# Patient Record
Sex: Female | Born: 1945 | Race: Black or African American | Hispanic: No | Marital: Married | Smoking: Former smoker
Health system: Southern US, Community
[De-identification: ages and names within clinical notes are randomized; demographics above are authoritative.]

## PROBLEM LIST (undated history)

## (undated) DIAGNOSIS — I10 Essential (primary) hypertension: Secondary | ICD-10-CM

## (undated) DIAGNOSIS — E785 Hyperlipidemia, unspecified: Secondary | ICD-10-CM

## (undated) HISTORY — DX: Essential (primary) hypertension: I10

## (undated) HISTORY — DX: Hyperlipidemia, unspecified: E78.5

---

## 1999-01-12 ENCOUNTER — Other Ambulatory Visit: Admission: RE | Admit: 1999-01-12 | Discharge: 1999-01-12 | Payer: Self-pay | Admitting: Obstetrics & Gynecology

## 1999-02-22 ENCOUNTER — Encounter: Payer: Self-pay | Admitting: Obstetrics & Gynecology

## 1999-02-22 ENCOUNTER — Encounter: Admission: RE | Admit: 1999-02-22 | Discharge: 1999-02-22 | Payer: Self-pay | Admitting: Obstetrics & Gynecology

## 1999-04-20 ENCOUNTER — Encounter: Payer: Self-pay | Admitting: Cardiology

## 1999-04-20 ENCOUNTER — Ambulatory Visit (HOSPITAL_COMMUNITY): Admission: RE | Admit: 1999-04-20 | Discharge: 1999-04-20 | Payer: Self-pay

## 2001-07-25 ENCOUNTER — Encounter: Payer: Self-pay | Admitting: Cardiology

## 2001-07-25 ENCOUNTER — Ambulatory Visit (HOSPITAL_COMMUNITY): Admission: RE | Admit: 2001-07-25 | Discharge: 2001-07-25 | Payer: Self-pay | Admitting: Cardiology

## 2005-11-21 ENCOUNTER — Ambulatory Visit (HOSPITAL_COMMUNITY): Admission: RE | Admit: 2005-11-21 | Discharge: 2005-11-21 | Payer: Self-pay | Admitting: Obstetrics and Gynecology

## 2007-04-28 ENCOUNTER — Encounter: Admission: RE | Admit: 2007-04-28 | Discharge: 2007-04-28 | Payer: Self-pay | Admitting: Obstetrics & Gynecology

## 2008-07-09 ENCOUNTER — Encounter (INDEPENDENT_AMBULATORY_CARE_PROVIDER_SITE_OTHER): Payer: Self-pay | Admitting: Cardiology

## 2008-07-09 ENCOUNTER — Ambulatory Visit: Payer: Self-pay | Admitting: Surgery

## 2008-07-09 ENCOUNTER — Ambulatory Visit (HOSPITAL_COMMUNITY): Admission: RE | Admit: 2008-07-09 | Discharge: 2008-07-09 | Payer: Self-pay | Admitting: Cardiology

## 2008-09-27 ENCOUNTER — Ambulatory Visit: Payer: Self-pay | Admitting: Surgery

## 2008-12-17 ENCOUNTER — Emergency Department (HOSPITAL_COMMUNITY): Admission: EM | Admit: 2008-12-17 | Discharge: 2008-12-18 | Payer: Self-pay | Admitting: Emergency Medicine

## 2009-09-27 ENCOUNTER — Ambulatory Visit (HOSPITAL_COMMUNITY): Admission: RE | Admit: 2009-09-27 | Discharge: 2009-09-27 | Payer: Self-pay | Admitting: Cardiology

## 2009-12-26 ENCOUNTER — Encounter: Admission: RE | Admit: 2009-12-26 | Discharge: 2009-12-26 | Payer: Self-pay | Admitting: Orthopedic Surgery

## 2010-02-18 ENCOUNTER — Encounter: Payer: Self-pay | Admitting: Obstetrics and Gynecology

## 2010-05-03 LAB — CBC
HCT: 38.6 % (ref 36.0–46.0)
Hemoglobin: 13.1 g/dL (ref 12.0–15.0)
MCV: 91.3 fL (ref 78.0–100.0)
RBC: 4.23 MIL/uL (ref 3.87–5.11)
WBC: 12.4 10*3/uL — ABNORMAL HIGH (ref 4.0–10.5)

## 2010-05-03 LAB — URINE MICROSCOPIC-ADD ON

## 2010-05-03 LAB — DIFFERENTIAL
Eosinophils Absolute: 0 10*3/uL (ref 0.0–0.7)
Eosinophils Relative: 0 % (ref 0–5)
Lymphs Abs: 1.4 10*3/uL (ref 0.7–4.0)
Monocytes Relative: 8 % (ref 3–12)

## 2010-05-03 LAB — URINALYSIS, ROUTINE W REFLEX MICROSCOPIC
Bilirubin Urine: NEGATIVE
Hgb urine dipstick: NEGATIVE
Protein, ur: 30 mg/dL — AB
Urobilinogen, UA: 1 mg/dL (ref 0.0–1.0)

## 2010-05-03 LAB — BASIC METABOLIC PANEL
Chloride: 98 mEq/L (ref 96–112)
GFR calc Af Amer: >60 mL/min (ref >60)
Potassium: 3.3 mEq/L — ABNORMAL LOW (ref 3.5–5.1)

## 2010-06-13 NOTE — Assessment & Plan Note (Signed)
OFFICE VISIT   Zoe Arnold, Zoe Arnold  DOB:  1945/07/30                                       09/27/2008  ZOXWR#:[id number]   REASON FOR VISIT:  Bilateral swelling.   HISTORY:  This is a 65 year old female I am seeing at the request of Dr.  Shana Chute for evaluation of leg swelling.  The patient states that she has  been having swelling in her right leg for quite some time.  In February,  while [place] the mountains hiking, she developed a stinging and burning  feeling in her left foot, her husband noticed it to be swollen.  She has  been having progressive problems with swelling.  She says that she had  some skin sloughing, but that has now resolved.  She does have some  white stockings that she has borrowed from her mother, that sound like  TED hose, that do improve the heaviness in her legs, elevation also  improves them and, obviously, her legs are worse when she is on them all  day.   PAST MEDICAL HISTORY:  Significant for hypertension.   FAMILY HISTORY:  Noncontributory.   SOCIAL HISTORY:  She is married with 2 children.  She is retired.  Does  not smoke, does not drink.   REVIEW OF SYSTEMS:  GENERAL:  Negative for fevers, chills, weight gain,  weight loss.  CARDIAC:  Negative.  PULMONARY:  Negative.  GI:  Positive for reflux and hiatal hernia.  GU:  Negative.  VASCULAR:  Slight pain in her legs with walking.  NEURO:  Negative.  ORTHO:  Negative.  PSYCH:  Negative.  ENT:  Negative.  HEME:  Negative.   MEDICATIONS:  Include Diovan, hydrochlorothiazide and potassium.   ALLERGIES:  Penicillin and tetracycline.   PHYSICAL EXAMINATION:  Blood pressure is 160/96, pulse is 93.  General:  She is well-appearing, in no acute distress.  She is normocephalic,  atraumatic.  Cardiovascular:  Regular rate and rhythm.  Abdomen:  Soft,  respiration is nonlabored.  Extremities:  Are warm and well-perfused.  She has palpable pulses bilaterally.  She has pitting edema  up to the  knee bilaterally.  There is mild hyperpigmentation in the left gaiter  area.  No ulceration.   DIAGNOSTIC STUDIES:  Duplex ultrasound was performed today that shows no  evidence of deep or superficial venous thrombus.  She has no evidence of  a reflux.   ASSESSMENT/PLAN:  Bilateral leg pain.   Plan:  I told the patient I believe her swelling is secondary to  lymphedema.  I have recommended getting fitted for compression  stockings, which I have given her a prescription for.  I have told her  to get measured in the morning so that fit appropriately, she does not  need to wear them at night.  I told her to keep her legs elevated  whenever possible.  She is also being referred to a lymphedema therapist  at [facility].   Jorge Ny, MD  Electronically Signed   VWB/MEDQ  D:  09/27/2008  T:  09/28/2008  Job:  [id number]   cc:   Osvaldo Shipper. Spruill, M.D.

## 2010-06-13 NOTE — Procedures (Signed)
DUPLEX DEEP VENOUS EXAM - LOWER EXTREMITY   INDICATION:  Left lower extremity edema   HISTORY:  Edema:  Yes  Trauma/Surgery:  No  Pain:  No  PE:  No  Previous DVT:  No  Anticoagulants:  No  Other:   DUPLEX EXAM:                CFV   SFV   PopV  PTV    GSV                R  L  R  L  R  L  R   L  R  L  Thrombosis    o  o     o     o      o     o  Spontaneous   +  +     +     +      +     +  Phasic        +  +     +     +      +     +  Augmentation  +  +     +     +      +     +  Compressible  +  +     +     +      +     +  Competent     +  +     +     +      +     +   Legend:  + - yes  o - no  p - partial  D - decreased   IMPRESSION:  No evidence of deep or superficial venous thrombosis in the  left lower extremity.    _____________________________  V. Charlena Cross, MD   AC/MEDQ  D:  09/27/2008  T:  09/27/2008  Job:  [id number]

## 2010-12-31 IMAGING — CR DG ABDOMEN ACUTE W/ 1V CHEST
3 series · 3 of 3 positions shown · non-contrast
Comparison: None

CLINICAL DATA: Abdominal pain, history acid reflux

ACUTE ABDOMEN SERIES (ABDOMEN 2 VIEW & CHEST 1 VIEW)

[w chest pa]
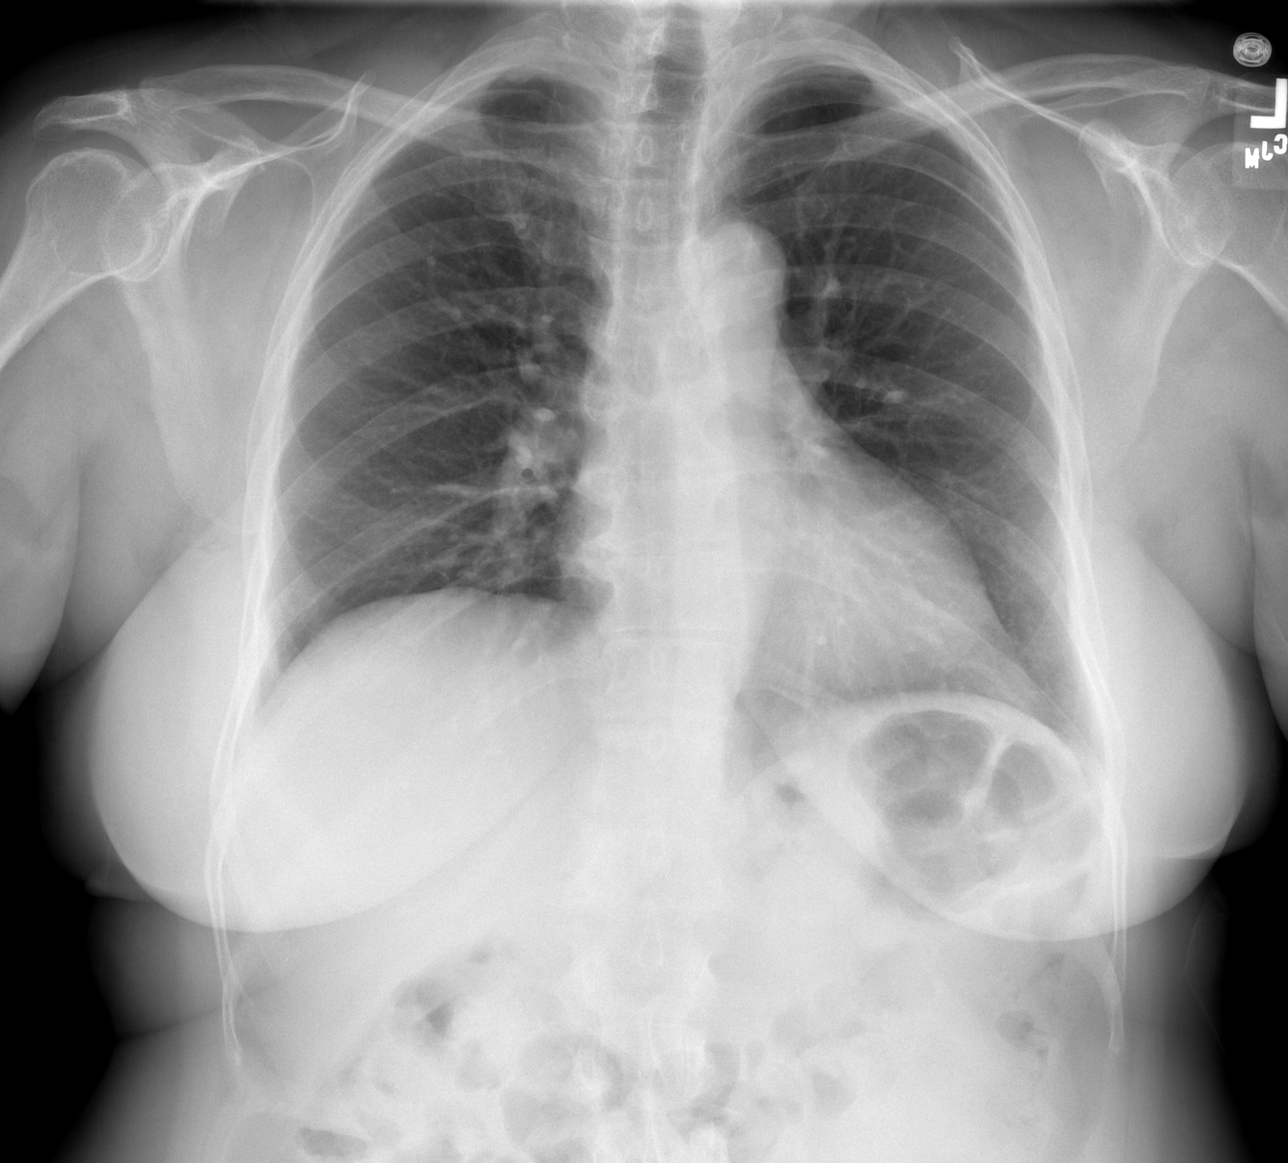

[w abdomen upright]
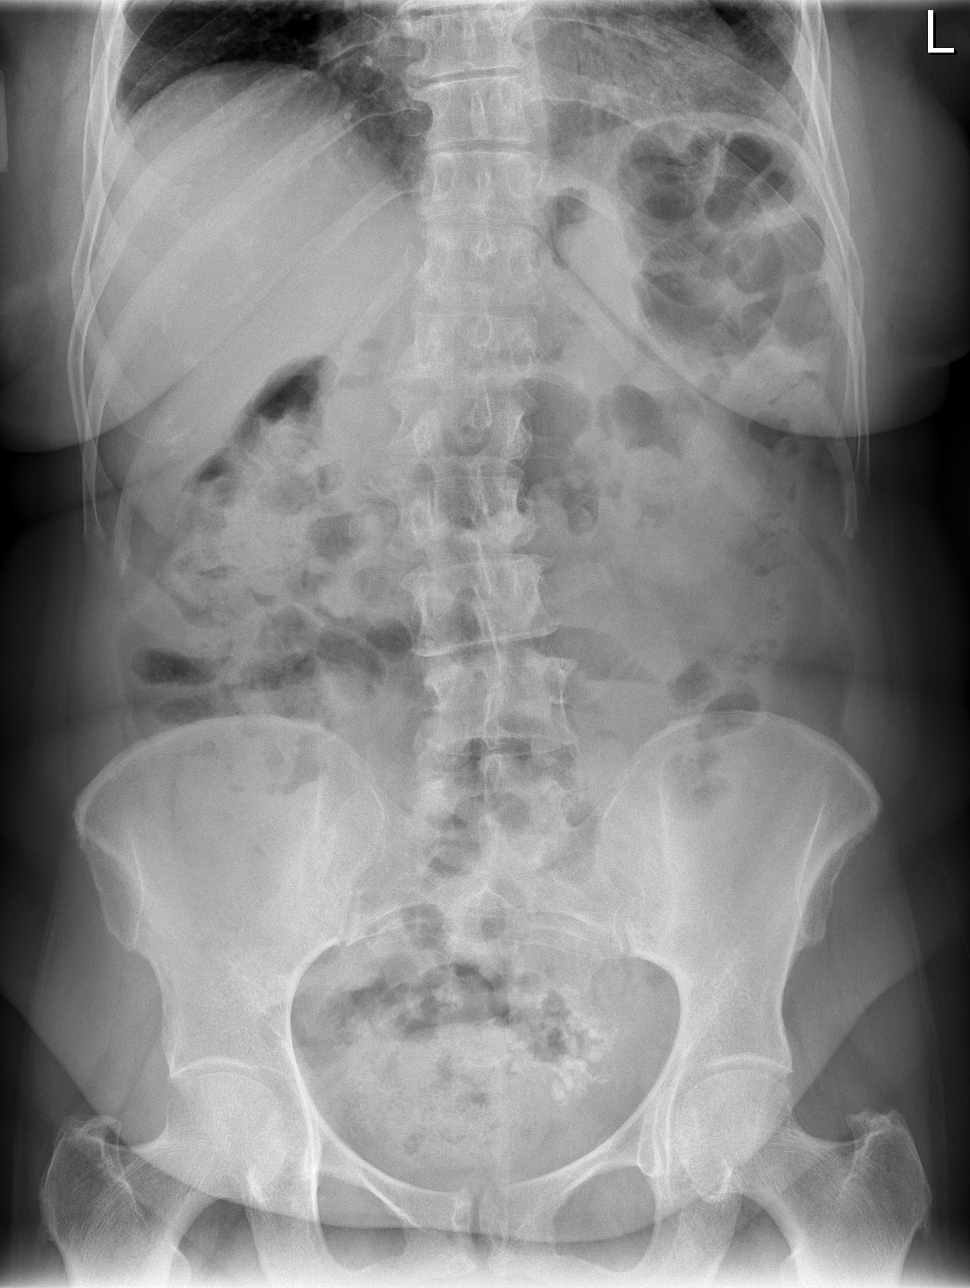

[t abdomen supine]
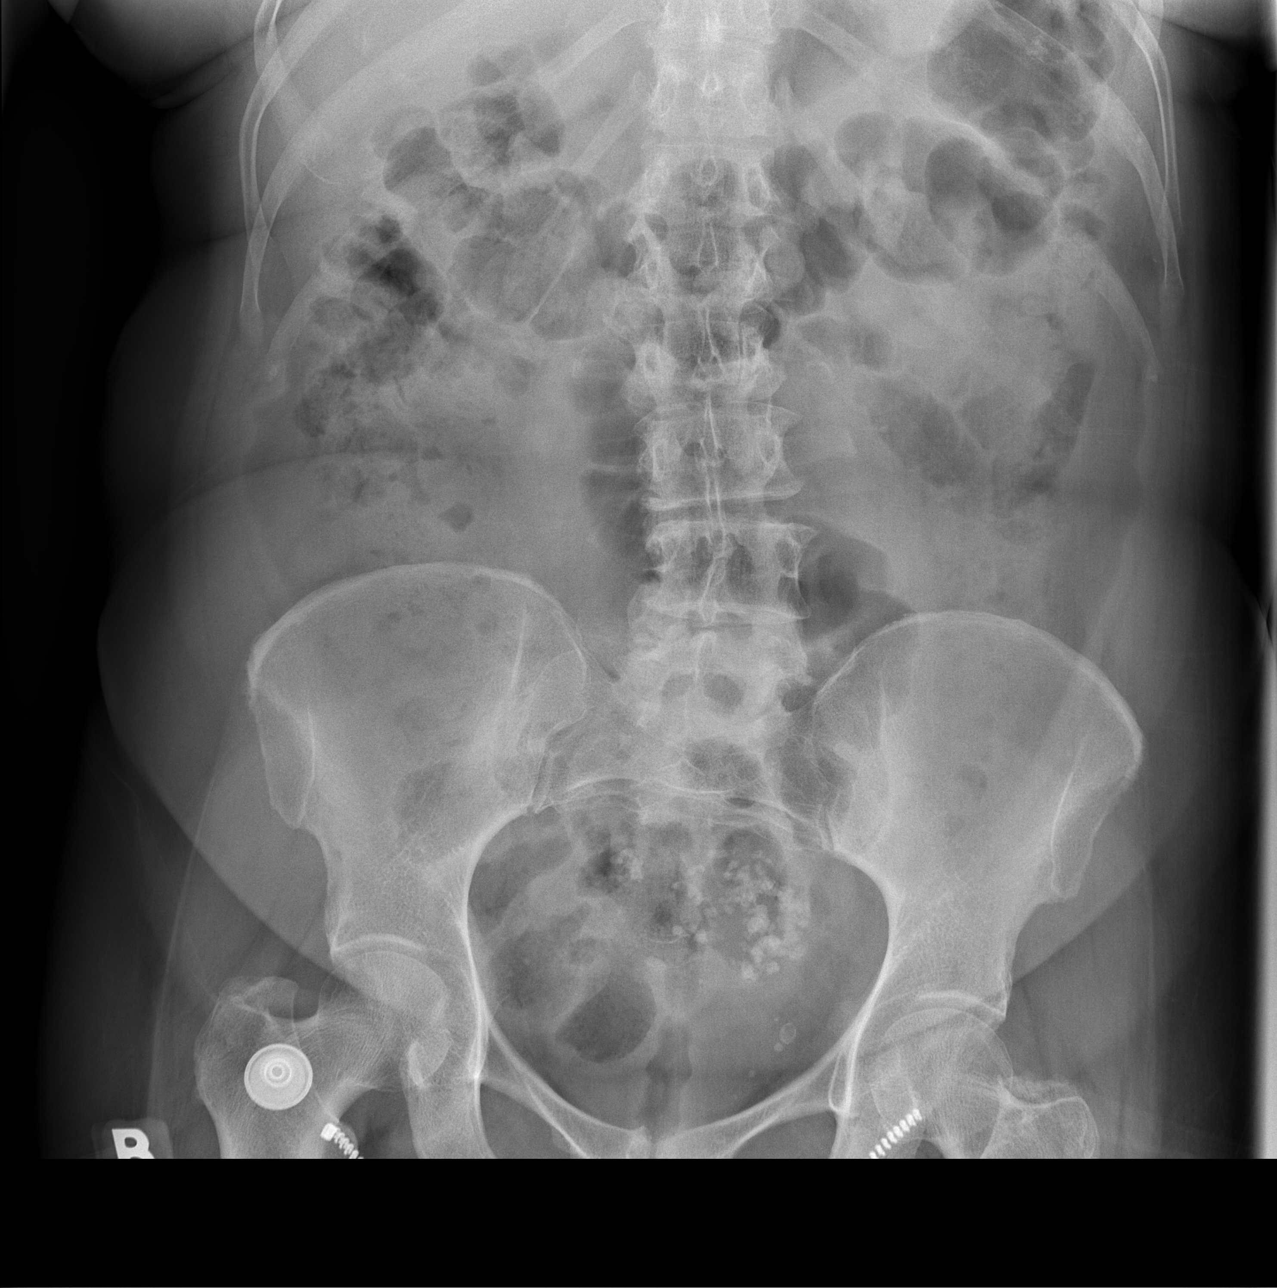

[3 of 3 positions shown; findings below may reference images not displayed]

FINDINGS: Upper normal heart size.
Normal mediastinal contours and pulmonary vascularity.
Atherosclerotic calcifications aortic arch.
Lungs clear.
Nonobstructive bowel gas pattern.
No bowel dilatation, bowel wall thickening or free air.
Scattered stool in colon.
Calcifications in pelvis likely represent calcified uterine
fibroids.
Scoliosis thoracolumbar spine.
No definite urinary tract calcification.
Mild bony demineralization.
IMPRESSION: No acute abnormalities.

## 2012-01-09 IMAGING — RF DG FLUORO GUIDE NDL PLC/BX
3 series · 3 of 3 positions shown · non-contrast
Comparison: None.

CLINICAL DATA: Right thumb pain.

FLUORO GUIDED NEEDLE PLACEMENT FOR CONTRAST INJECTION, MR
ARTHROGRAPHY, RIGHT THUMB MCP JOINT

[Series 3: arthrogram · 1 of 1 slices shown (1 of 3)]
[im 1/1]
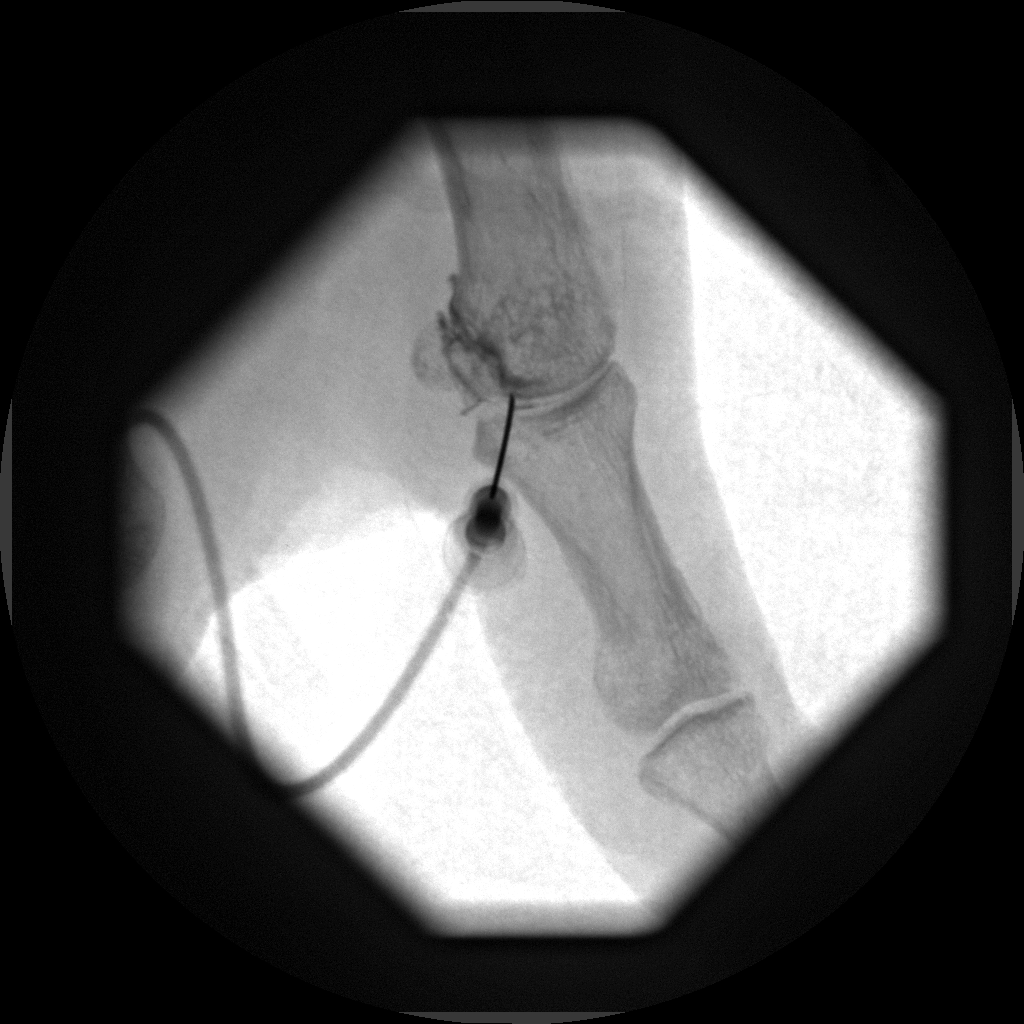

[Series 4: arthrogram · 1 of 1 slices shown (2 of 3)]
[im 1/1]
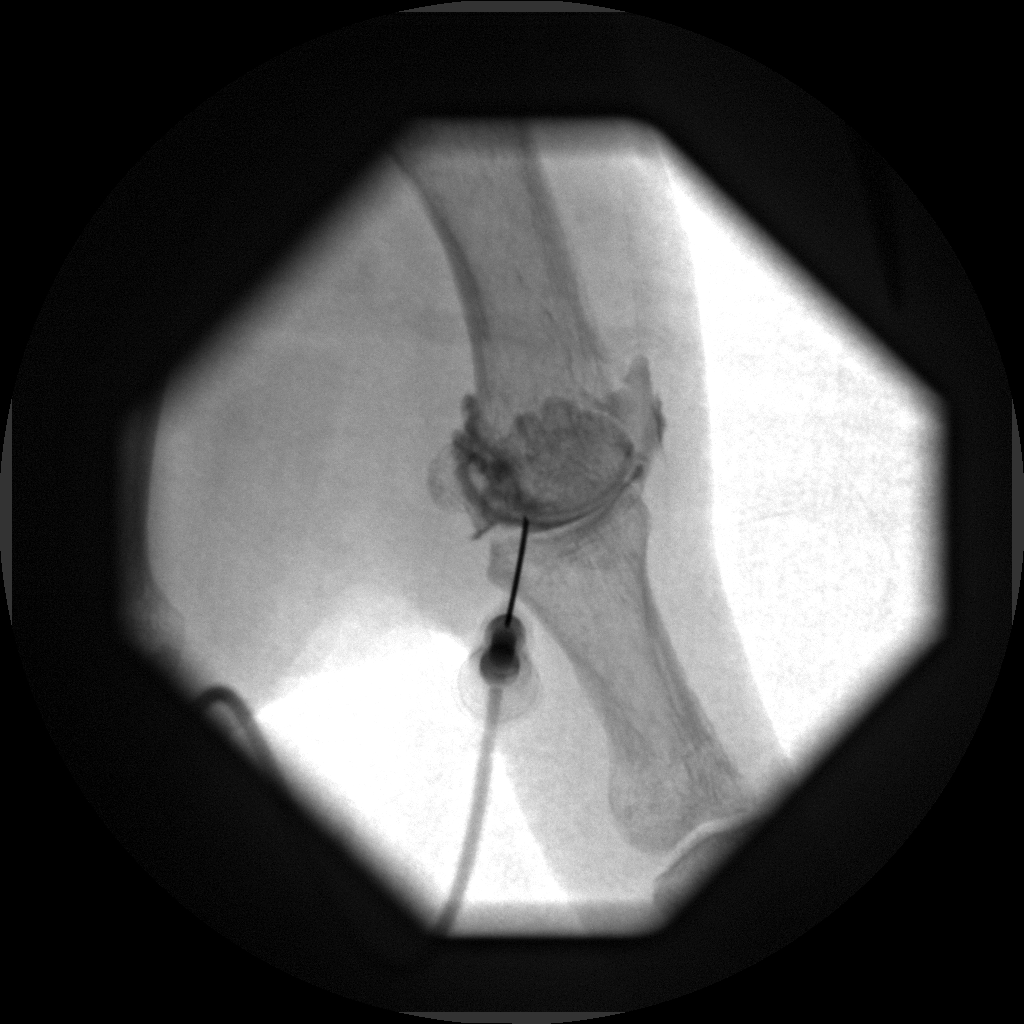

[Series 5: arthrogram · 1 of 1 slices shown (3 of 3)]
[im 1/1]
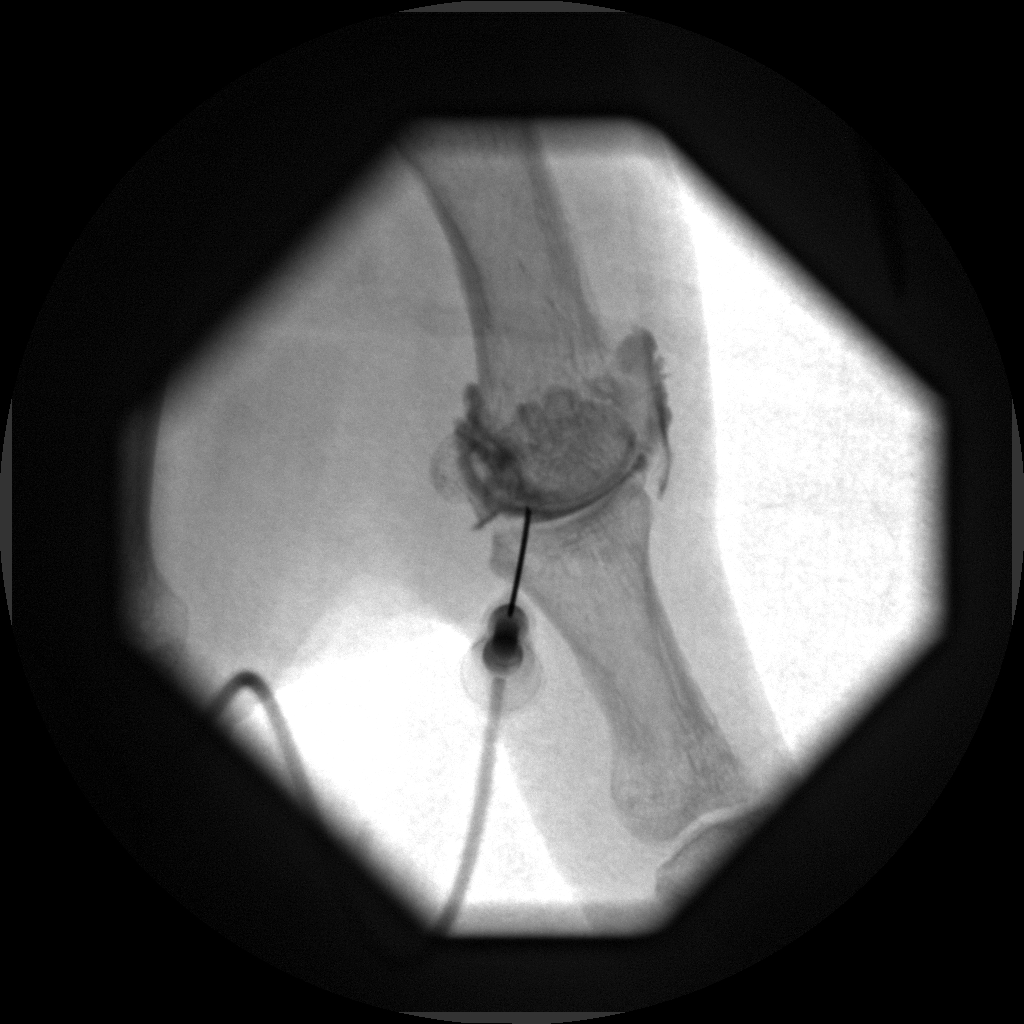

[3 of 3 positions shown; findings below may reference images not displayed]

Procedure: Informed, written, consent was obtained from the
patient, including risk of bleeding, infection, pain, and
neurovascular injury.

The dorsal aspect of the hand and wrist on the radial aspect was
cleansed thoroughly with Betadine  scrub, and sterile drapes were
applied.  Initially the first carpometacarpal joint was entered
with a 25 gauge needle due to a miscommunication with the patient
who indicated that her pain was more toward the CMC joint.  A small
amount contrast was injected into this joint.  The needle was
immediately removed and the attention was directed to the MCP
joint.  A 25 gauge needle was placed into the dorsomedial aspect of
the right thumb metacarpophalangeal joint under direct fluoroscopic
guidance.  A [DATE]  dilution of Dalvin with 1% lidocaine was injected
into the joint and monitored fluoroscopically to which a [DATE]
dilution of MultiHance was added.     The study was performed as a
prelude to MR arthrography, and not as a diagnostic arthrogram.

The needle was withdrawn and a sterile bandage was applied.
Patient was escorted to the MR suite for further imaging.
IMPRESSION: Technically successful fluoroscopically guided needle placement and
contrast injection for MR arthrography, right MCP joint.

## 2012-12-24 ENCOUNTER — Ambulatory Visit: Payer: Self-pay | Admitting: Obstetrics & Gynecology

## 2012-12-31 ENCOUNTER — Encounter: Payer: Self-pay | Admitting: Obstetrics & Gynecology

## 2012-12-31 ENCOUNTER — Ambulatory Visit (INDEPENDENT_AMBULATORY_CARE_PROVIDER_SITE_OTHER): Payer: Medicare Other | Admitting: Obstetrics & Gynecology

## 2012-12-31 VITALS — BP 138/88 | HR 80 | Temp 98.7°F | Ht 62.0 in | Wt 164.0 lb

## 2012-12-31 DIAGNOSIS — R350 Frequency of micturition: Secondary | ICD-10-CM

## 2012-12-31 DIAGNOSIS — Z124 Encounter for screening for malignant neoplasm of cervix: Secondary | ICD-10-CM

## 2012-12-31 DIAGNOSIS — Z01419 Encounter for gynecological examination (general) (routine) without abnormal findings: Secondary | ICD-10-CM

## 2012-12-31 DIAGNOSIS — R319 Hematuria, unspecified: Secondary | ICD-10-CM

## 2012-12-31 LAB — POCT URINALYSIS DIPSTICK
Bilirubin, UA: NEGATIVE
Ketones, UA: NEGATIVE
Nitrite, UA: NEGATIVE
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
## 2013-01-01 LAB — URINALYSIS
Hgb urine dipstick: NEGATIVE
Nitrite: NEGATIVE
Specific Gravity, Urine: 1.024 (ref 1.005–1.030)
Urobilinogen, UA: 0.2 mg/dL (ref 0.0–1.0)
pH: 5.5 (ref 5.0–8.0)

## 2013-01-02 LAB — PAP IG AND HPV HIGH-RISK

## 2013-01-03 NOTE — Patient Instructions (Signed)
Hematuria, Adult Hematuria (blood in your urine) can be caused by a bladder infection (cystitis), kidney infection (pyelonephritis), prostate infection (prostatitis), or kidney stone. Infections will usually respond to antibiotics (medications which kill germs), and a kidney stone will usually pass through your urine without further treatment. If you were put on antibiotics, take all the medicine until gone. You may feel better in a few days, but take all of your medicine or the infection may not respond and become more difficult to treat. If antibiotics were not given, an infection did not cause the blood in the urine. A further work up to find out the reason may be needed. HOME CARE INSTRUCTIONS   Drink lots of fluid, 3 to 4 quarts a day. If you have been diagnosed with an infection, cranberry juice is especially recommended, in addition to large amounts of water.  Avoid caffeine, tea, and carbonated beverages, because they tend to irritate the bladder.  Avoid alcohol as it may irritate the prostate.  Only take over-the-counter or prescription medicines for pain, discomfort, or fever as directed by your caregiver.  If you have been diagnosed with a kidney stone follow your caregivers instructions regarding straining your urine to catch the stone. TO PREVENT FURTHER INFECTIONS:  Empty the bladder often. Avoid holding urine for long periods of time.  After a bowel movement, women should cleanse front to back. Use each tissue only once.  Empty the bladder before and after sexual intercourse if you are a female.  Return to your caregiver if you develop back pain, fever, nausea (feeling sick to your stomach), vomiting, or your symptoms (problems) are not better in 3 days. Return sooner if you are getting worse. If you have been requested to return for further testing make sure to keep your appointments. If an infection is not the cause of blood in your urine, X-rays may be required. Your caregiver  will discuss this with you. SEEK IMMEDIATE MEDICAL CARE IF:   You have a persistent fever over 102 F (38.9 C).  You develop severe vomiting and are unable to keep the medication down.  You develop severe back or abdominal pain despite taking your medications.  You begin passing a large amount of blood or clots in your urine.  You feel extremely weak or faint, or pass out. MAKE SURE YOU:   Understand these instructions.  Will watch your condition.  Will get help right away if you are not doing well or get worse. Document Released: 01/15/2005 Document Revised: 04/09/2011 Document Reviewed: 09/04/2007 ExitCare Patient Information 2014 [employer].  

## 2013-11-30 ENCOUNTER — Encounter: Payer: Self-pay | Admitting: Obstetrics & Gynecology

## 2014-01-25 ENCOUNTER — Encounter: Payer: Self-pay | Admitting: *Deleted

## 2014-01-26 ENCOUNTER — Encounter: Payer: Self-pay | Admitting: Obstetrics & Gynecology

## 2019-03-21 ENCOUNTER — Ambulatory Visit: Payer: Self-pay | Attending: Internal Medicine

## 2019-03-21 DIAGNOSIS — Z23 Encounter for immunization: Secondary | ICD-10-CM | POA: Insufficient documentation

## 2019-03-21 NOTE — Progress Notes (Signed)
   Covid-19 Vaccination Clinic  Name:  Zoe Arnold    MRN: [id number] DOB: 28-Aug-1945  03/21/2019  Ms. Court was observed post Covid-19 immunization for 15 minutes without incidence. She was provided with Vaccine Information Sheet and instruction to access the V-Safe system.   [name] was instructed to call 911 with any severe reactions post vaccine: Marland Kitchen Difficulty breathing  . Swelling of your face and throat  . A fast heartbeat  . A bad rash all over your body  . Dizziness and weakness    Immunizations Administered    Name Date Dose VIS Date Route   Pfizer COVID-19 Vaccine 03/21/2019  1:42 PM 0.3 mL 01/09/2019 Intramuscular   Manufacturer: [employer], [employer]   Lot: [id number]   NDC: [address]-3

## 2019-04-13 ENCOUNTER — Ambulatory Visit: Payer: Self-pay | Attending: Internal Medicine

## 2019-04-13 DIAGNOSIS — Z23 Encounter for immunization: Secondary | ICD-10-CM

## 2019-04-13 NOTE — Progress Notes (Signed)
   Covid-19 Vaccination Clinic  Name:  Zoe Arnold    MRN: [id number] DOB: 07-22-1945  04/13/2019  [name] was observed post Covid-19 immunization for 15 minutes without incident. She was provided with Vaccine Information Sheet and instruction to access the V-Safe system.   [name] was instructed to call 911 with any severe reactions post vaccine: Marland Kitchen Difficulty breathing  . Swelling of face and throat  . A fast heartbeat  . A bad rash all over body  . Dizziness and weakness   Immunizations Administered    Name Date Dose VIS Date Route   Pfizer COVID-19 Vaccine 04/13/2019  1:51 PM 0.3 mL 01/09/2019 Intramuscular   Manufacturer: [employer], [employer]   Lot: [id number]   NDC: [address]-3

## 2020-02-16 ENCOUNTER — Encounter (HOSPITAL_COMMUNITY): Payer: Self-pay

## 2020-02-16 ENCOUNTER — Emergency Department (HOSPITAL_COMMUNITY): Payer: Medicare Other

## 2020-02-16 ENCOUNTER — Emergency Department (HOSPITAL_COMMUNITY)
Admission: EM | Admit: 2020-02-16 | Discharge: 2020-02-16 | Disposition: A | Discharge status: Home / Self Care | Payer: Medicare Other | Attending: Emergency Medicine | Admitting: Emergency Medicine

## 2020-02-16 DIAGNOSIS — R519 Headache, unspecified: Secondary | ICD-10-CM | POA: Insufficient documentation

## 2020-02-16 DIAGNOSIS — Z7982 Long term (current) use of aspirin: Secondary | ICD-10-CM | POA: Insufficient documentation

## 2020-02-16 DIAGNOSIS — Z87891 Personal history of nicotine dependence: Secondary | ICD-10-CM | POA: Diagnosis not present

## 2020-02-16 DIAGNOSIS — Z79899 Other long term (current) drug therapy: Secondary | ICD-10-CM | POA: Insufficient documentation

## 2020-02-16 DIAGNOSIS — I10 Essential (primary) hypertension: Secondary | ICD-10-CM | POA: Insufficient documentation

## 2020-02-16 DIAGNOSIS — R42 Dizziness and giddiness: Secondary | ICD-10-CM | POA: Insufficient documentation

## 2020-02-16 LAB — CBC
HCT: 43.7 % (ref 36.0–46.0)
Hemoglobin: 14 g/dL (ref 12.0–15.0)
MCH: 30.5 pg (ref 26.0–34.0)
MCHC: 32 g/dL (ref 30.0–36.0)
MCV: 95.2 fL (ref 80.0–100.0)
Platelets: 234 10*3/uL (ref 150–400)
RBC: 4.59 MIL/uL (ref 3.87–5.11)
RDW: 13.1 % (ref 11.5–15.5)
WBC: 8.1 10*3/uL (ref 4.0–10.5)
nRBC: 0 % (ref 0.0–0.2)

## 2020-02-16 LAB — URINALYSIS, ROUTINE W REFLEX MICROSCOPIC
Bilirubin Urine: NEGATIVE
Glucose, UA: NEGATIVE mg/dL
Hgb urine dipstick: NEGATIVE
Ketones, ur: NEGATIVE mg/dL
Nitrite: NEGATIVE
Protein, ur: 30 mg/dL — AB
Specific Gravity, Urine: 1.018 (ref 1.005–1.030)
pH: 6 (ref 5.0–8.0)

## 2020-02-16 LAB — BASIC METABOLIC PANEL
Anion gap: 10 (ref 5–15)
BUN: 15 mg/dL (ref 8–23)
CO2: 28 mmol/L (ref 22–32)
Calcium: 9.1 mg/dL (ref 8.9–10.3)
Chloride: 101 mmol/L (ref 98–111)
Creatinine, Ser: 0.87 mg/dL (ref 0.44–1.00)
GFR, Estimated: >60 mL/min (ref >60)
Glucose, Bld: 117 mg/dL — ABNORMAL HIGH (ref 70–99)
Potassium: 3.5 mmol/L (ref 3.5–5.1)
Sodium: 139 mmol/L (ref 135–145)

## 2020-02-16 LAB — TROPONIN I (HIGH SENSITIVITY)
Troponin I (High Sensitivity): 7 ng/L (ref <18)
Troponin I (High Sensitivity): 7 ng/L (ref <18)

## 2020-02-16 MED ORDER — DIAZEPAM 5 MG PO TABS: 5.0000 mg | ORAL_TABLET | Freq: Two times a day (BID) | ORAL | 0 refills | Status: DC

## 2020-02-16 NOTE — ED Notes (Signed)
Patient transported to CT 

## 2020-02-16 NOTE — ED Triage Notes (Signed)
Pt reports that she has been feeling dizzy for the past few days, tonight had a nose bleed and she checked her BP, hypertensive at 200 systolic.

## 2020-02-16 NOTE — ED Provider Notes (Signed)
[facility] EMERGENCY DEPARTMENT Provider Note   CSN: [id number] Arrival date & time: 02/16/20  0242     History Chief Complaint  Patient presents with  . Hypertension    Zoe Arnold is a 75 y.o. female.  Pt presents to the ED today with elevated blood pressure and feeling dizzy.  Pt has been feeling dizzy for the past 2 days.  She has also had a headache.  She checked her BP last night and it was over 200.  She took her normal bp meds last night around 9pm.  BP check was a few hrs after her meds were taken.  She became concerned, so she came in.  Pt no longer feels dizzy.  She continues to have a headache.  She has not had any medication changes, but said the pharmacy was out of her losartan-hctz last week, so she missed a few days.          Past Medical History:  Diagnosis Date  . Hyperlipidemia   . Hypertension     There are no problems to display for this patient.   History reviewed. No pertinent surgical history.   OB History    Gravida  3   Para  2   Term  2   Preterm      AB  1   Living  2     SAB  1   IAB      Ectopic      Multiple      Live Births  2           Family History  Problem Relation Age of Onset  . Heart disease Mother   . Cancer Father     Social History   Tobacco Use  . Smoking status: Former Smoker    Quit date: 01/01/1983    Years since quitting: 37.1  Substance Use Topics  . Alcohol use: No  . Drug use: No    Home Medications Prior to Admission medications   Medication Sig Start Date End Date Taking? Authorizing Provider  diazepam (VALIUM) 5 MG tablet Take 1 tablet (5 mg total) by mouth 2 (two) times daily. 02/16/20  Yes Jacalyn Lefevre, MD  aspirin EC 81 MG tablet Take 81 mg by mouth daily.    [provider]  B Complex Vitamins (B-COMPLEX/B-12) LIQD Place under the tongue.    [provider]  b complex vitamins tablet Take 1 tablet by mouth daily.    [provider]  Omega-3 Fatty Acids (FISH OIL) 1000 MG CAPS Take by mouth.    [provider]  rosuvastatin (CRESTOR) 5 MG tablet Take 5 mg by mouth daily.    [provider]  valsartan-hydrochlorothiazide (DIOVAN-HCT) 160-12.5 MG per tablet Take 1 tablet by mouth daily.    [provider]    Allergies    Penicillins and Tetracyclines & related  Review of Systems   Review of Systems  Neurological: Positive for dizziness and headaches.  All other systems reviewed and are negative.   Physical Exam Updated Vital Signs BP (!) 158/80   Pulse 85   Temp 98.2 F (36.8 C)   Resp 18   SpO2 97%   Physical Exam Vitals and nursing note reviewed.  Constitutional:      Appearance: Normal appearance.  HENT:     Head: Normocephalic and atraumatic.     Right Ear: External ear normal.     Left Ear: External ear  normal.     Nose: Nose normal.     Mouth/Throat:     Mouth: Mucous membranes are moist.     Pharynx: Oropharynx is clear.  Eyes:     Extraocular Movements: Extraocular movements intact.     Conjunctiva/sclera: Conjunctivae normal.     Pupils: Pupils are equal, round, and reactive to light.  Cardiovascular:     Rate and Rhythm: Normal rate and regular rhythm.     Pulses: Normal pulses.     Heart sounds: Normal heart sounds.  Pulmonary:     Effort: Pulmonary effort is normal.     Breath sounds: Normal breath sounds.  Abdominal:     General: Abdomen is flat. Bowel sounds are normal.     Palpations: Abdomen is soft.  Musculoskeletal:        General: Normal range of motion.     Cervical back: Normal range of motion and neck supple.     Right lower leg: Edema present.     Left lower leg: Edema present.  Skin:    General: Skin is warm.     Capillary Refill: Capillary refill takes less than 2 seconds.  Neurological:     General: No focal deficit present.     Mental Status: She is alert and oriented to person, place, and time.  Psychiatric:         Mood and Affect: Mood normal.        Behavior: Behavior normal.        Thought Content: Thought content normal.        Judgment: Judgment normal.     ED Results / Procedures / Treatments   Labs (all labs ordered are listed, but only abnormal results are displayed) Labs Reviewed  BASIC METABOLIC PANEL - Abnormal; Notable for the following components:      Result Value   Glucose, Bld 117 (*)    All other components within normal limits  CBC  URINALYSIS, ROUTINE W REFLEX MICROSCOPIC  TROPONIN I (HIGH SENSITIVITY)  TROPONIN I (HIGH SENSITIVITY)    EKG EKG Interpretation  Date/Time:  Tuesday February 16 2020 02:57:24 EST Ventricular Rate:  65 PR Interval:  132 QRS Duration: 62 QT Interval:  352 QTC Calculation: 366 R Axis:   18 Text Interpretation: Normal sinus rhythm Low voltage QRS Cannot rule out Anterior infarct , age undetermined Abnormal ECG No significant change since last tracing Confirmed by Jacalyn Lefevre [phone]) on 02/16/2020 8:18:50 AM   Radiology CT Head Wo Contrast  Result Date: 02/16/2020 CLINICAL DATA:  Mental status change, dizzy for past few days with nose bleed and hypertensive. No history of stroke. EXAM: CT HEAD WITHOUT CONTRAST TECHNIQUE: Contiguous axial images were obtained from the base of the skull through the vertex without intravenous contrast. COMPARISON:  None. FINDINGS: Brain: No evidence of acute infarction, hemorrhage, hydrocephalus, extra-axial collection or mass lesion/mass effect. Vascular: No hyperdense vessel or unexpected calcification. Atherosclerotic calcifications of the internal carotid and basilar arteries. Skull: Hyperostosis frontalis. Negative for fracture or focal lesion. Sinuses/Orbits: No acute finding. Other: None. IMPRESSION: No acute intracranial pathology. Electronically Signed   By: Maudry Mayhew MD   On: 02/16/2020 09:13    Procedures Procedures (including critical care time)  Medications Ordered in ED Medications - No  data to display  ED Course  I have reviewed the triage vital signs and the nursing notes.  Pertinent labs & imaging results that were available during my care of the patient were reviewed by me and  considered in my medical decision making (see chart for details).    MDM Rules/Calculators/A&P                          Pt is feeling much better.  She said she's been under a lot of stress lately.  A family member was found dead in her home and she was in her 53s.  Dizziness is better and pt is neurologically intact.  I doubt CVA.  [name] bp is better with no intervention, but is still slightly high.  She is going to call Dr. Sharyn Lull to see what he recommends for her bp.    Pt is stable for d/c.  Return if worse.  Final Clinical Impression(s) / ED Diagnoses Final diagnoses:  Primary hypertension  Vertigo    Rx / DC Orders ED Discharge Orders         Ordered    diazepam (VALIUM) 5 MG tablet  2 times daily        02/16/20 1013           Jacalyn Lefevre, MD 02/16/20 1056

## 2021-06-10 ENCOUNTER — Emergency Department (HOSPITAL_COMMUNITY): Payer: Medicare Other

## 2021-06-10 ENCOUNTER — Inpatient Hospital Stay (HOSPITAL_COMMUNITY): Payer: Medicare Other

## 2021-06-10 ENCOUNTER — Inpatient Hospital Stay (HOSPITAL_COMMUNITY)
Admission: EM | Admit: 2021-06-10 | Discharge: 2021-06-16 | DRG: 518 | Disposition: A | Discharge status: Rehabilitation Facility | Payer: Medicare Other | Attending: General Surgery | Admitting: General Surgery

## 2021-06-10 DIAGNOSIS — S37899A Unspecified injury of other urinary and pelvic organ, initial encounter: Secondary | ICD-10-CM | POA: Diagnosis not present

## 2021-06-10 DIAGNOSIS — I10 Essential (primary) hypertension: Secondary | ICD-10-CM | POA: Diagnosis present

## 2021-06-10 DIAGNOSIS — Z6831 Body mass index (BMI) 31.0-31.9, adult: Secondary | ICD-10-CM | POA: Diagnosis not present

## 2021-06-10 DIAGNOSIS — K5901 Slow transit constipation: Secondary | ICD-10-CM | POA: Diagnosis not present

## 2021-06-10 DIAGNOSIS — Z79899 Other long term (current) drug therapy: Secondary | ICD-10-CM | POA: Diagnosis not present

## 2021-06-10 DIAGNOSIS — E739 Lactose intolerance, unspecified: Secondary | ICD-10-CM | POA: Diagnosis present

## 2021-06-10 DIAGNOSIS — Z880 Allergy status to penicillin: Secondary | ICD-10-CM

## 2021-06-10 DIAGNOSIS — Z7982 Long term (current) use of aspirin: Secondary | ICD-10-CM

## 2021-06-10 DIAGNOSIS — S32810A Multiple fractures of pelvis with stable disruption of pelvic ring, initial encounter for closed fracture: Secondary | ICD-10-CM | POA: Diagnosis present

## 2021-06-10 DIAGNOSIS — S32301D Unspecified fracture of right ilium, subsequent encounter for fracture with routine healing: Secondary | ICD-10-CM | POA: Diagnosis not present

## 2021-06-10 DIAGNOSIS — Y92239 Unspecified place in hospital as the place of occurrence of the external cause: Secondary | ICD-10-CM | POA: Diagnosis not present

## 2021-06-10 DIAGNOSIS — S32591D Other specified fracture of right pubis, subsequent encounter for fracture with routine healing: Secondary | ICD-10-CM | POA: Diagnosis present

## 2021-06-10 DIAGNOSIS — E669 Obesity, unspecified: Secondary | ICD-10-CM | POA: Diagnosis present

## 2021-06-10 DIAGNOSIS — E785 Hyperlipidemia, unspecified: Secondary | ICD-10-CM | POA: Diagnosis present

## 2021-06-10 DIAGNOSIS — Y92410 Unspecified street and highway as the place of occurrence of the external cause: Secondary | ICD-10-CM

## 2021-06-10 DIAGNOSIS — N390 Urinary tract infection, site not specified: Secondary | ICD-10-CM | POA: Diagnosis not present

## 2021-06-10 DIAGNOSIS — Z87891 Personal history of nicotine dependence: Secondary | ICD-10-CM

## 2021-06-10 DIAGNOSIS — E876 Hypokalemia: Secondary | ICD-10-CM | POA: Diagnosis present

## 2021-06-10 DIAGNOSIS — S32301A Unspecified fracture of right ilium, initial encounter for closed fracture: Secondary | ICD-10-CM | POA: Diagnosis not present

## 2021-06-10 DIAGNOSIS — Z20822 Contact with and (suspected) exposure to covid-19: Secondary | ICD-10-CM | POA: Diagnosis present

## 2021-06-10 DIAGNOSIS — Z881 Allergy status to other antibiotic agents status: Secondary | ICD-10-CM | POA: Diagnosis not present

## 2021-06-10 DIAGNOSIS — R102 Pelvic and perineal pain: Secondary | ICD-10-CM | POA: Diagnosis present

## 2021-06-10 DIAGNOSIS — S332XXA Dislocation of sacroiliac and sacrococcygeal joint, initial encounter: Secondary | ICD-10-CM | POA: Diagnosis not present

## 2021-06-10 DIAGNOSIS — R339 Retention of urine, unspecified: Secondary | ICD-10-CM | POA: Diagnosis present

## 2021-06-10 DIAGNOSIS — B9620 Unspecified Escherichia coli [E. coli] as the cause of diseases classified elsewhere: Secondary | ICD-10-CM | POA: Diagnosis not present

## 2021-06-10 DIAGNOSIS — D62 Acute posthemorrhagic anemia: Secondary | ICD-10-CM | POA: Diagnosis not present

## 2021-06-10 DIAGNOSIS — S3210XA Unspecified fracture of sacrum, initial encounter for closed fracture: Secondary | ICD-10-CM | POA: Diagnosis present

## 2021-06-10 DIAGNOSIS — B961 Klebsiella pneumoniae [K. pneumoniae] as the cause of diseases classified elsewhere: Secondary | ICD-10-CM | POA: Diagnosis not present

## 2021-06-10 DIAGNOSIS — S32309D Unspecified fracture of unspecified ilium, subsequent encounter for fracture with routine healing: Secondary | ICD-10-CM | POA: Diagnosis not present

## 2021-06-10 DIAGNOSIS — I959 Hypotension, unspecified: Secondary | ICD-10-CM | POA: Diagnosis present

## 2021-06-10 DIAGNOSIS — K661 Hemoperitoneum: Secondary | ICD-10-CM | POA: Diagnosis present

## 2021-06-10 DIAGNOSIS — Z472 Encounter for removal of internal fixation device: Secondary | ICD-10-CM | POA: Diagnosis not present

## 2021-06-10 DIAGNOSIS — Z8249 Family history of ischemic heart disease and other diseases of the circulatory system: Secondary | ICD-10-CM | POA: Diagnosis not present

## 2021-06-10 DIAGNOSIS — R5381 Other malaise: Secondary | ICD-10-CM | POA: Diagnosis present

## 2021-06-10 DIAGNOSIS — E119 Type 2 diabetes mellitus without complications: Secondary | ICD-10-CM | POA: Diagnosis present

## 2021-06-10 DIAGNOSIS — E559 Vitamin D deficiency, unspecified: Secondary | ICD-10-CM | POA: Diagnosis present

## 2021-06-10 DIAGNOSIS — R5383 Other fatigue: Secondary | ICD-10-CM | POA: Diagnosis present

## 2021-06-10 DIAGNOSIS — M25559 Pain in unspecified hip: Secondary | ICD-10-CM | POA: Diagnosis not present

## 2021-06-10 LAB — COMPREHENSIVE METABOLIC PANEL
ALT: 18 U/L (ref 0–44)
AST: 35 U/L (ref 15–41)
Albumin: 3.6 g/dL (ref 3.5–5.0)
Alkaline Phosphatase: 68 U/L (ref 38–126)
Anion gap: 10 (ref 5–15)
BUN: 12 mg/dL (ref 8–23)
CO2: 25 mmol/L (ref 22–32)
Calcium: 9 mg/dL (ref 8.9–10.3)
Chloride: 104 mmol/L (ref 98–111)
Creatinine, Ser: 0.85 mg/dL (ref 0.44–1.00)
GFR, Estimated: >60 mL/min (ref >60)
Glucose, Bld: 108 mg/dL — ABNORMAL HIGH (ref 70–99)
Potassium: 3.5 mmol/L (ref 3.5–5.1)
Sodium: 139 mmol/L (ref 135–145)
Total Bilirubin: 1.8 mg/dL — ABNORMAL HIGH (ref 0.3–1.2)
Total Protein: 6.3 g/dL — ABNORMAL LOW (ref 6.5–8.1)

## 2021-06-10 LAB — I-STAT CHEM 8, ED
BUN: 15 mg/dL (ref 8–23)
Calcium, Ion: 1.05 mmol/L — ABNORMAL LOW (ref 1.15–1.40)
Chloride: 103 mmol/L (ref 98–111)
Creatinine, Ser: 0.8 mg/dL (ref 0.44–1.00)
Glucose, Bld: 107 mg/dL — ABNORMAL HIGH (ref 70–99)
HCT: 41 % (ref 36.0–46.0)
Hemoglobin: 13.9 g/dL (ref 12.0–15.0)
Potassium: 3.4 mmol/L — ABNORMAL LOW (ref 3.5–5.1)
Sodium: 140 mmol/L (ref 135–145)
TCO2: 25 mmol/L (ref 22–32)

## 2021-06-10 LAB — URINALYSIS, ROUTINE W REFLEX MICROSCOPIC
Bilirubin Urine: NEGATIVE
Glucose, UA: NEGATIVE mg/dL
Ketones, ur: NEGATIVE mg/dL
Leukocytes,Ua: NEGATIVE
Nitrite: NEGATIVE
Protein, ur: 100 mg/dL — AB
RBC / HPF: >50 RBC/hpf — ABNORMAL HIGH (ref 0–5)
Specific Gravity, Urine: 1.018 (ref 1.005–1.030)
pH: 6 (ref 5.0–8.0)

## 2021-06-10 LAB — CBC
HCT: 41.9 % (ref 36.0–46.0)
HCT: 46.6 % — ABNORMAL HIGH (ref 36.0–46.0)
Hemoglobin: 13.2 g/dL (ref 12.0–15.0)
Hemoglobin: 14.8 g/dL (ref 12.0–15.0)
MCH: 30.7 pg (ref 26.0–34.0)
MCH: 30.8 pg (ref 26.0–34.0)
MCHC: 31.5 g/dL (ref 30.0–36.0)
MCHC: 31.8 g/dL (ref 30.0–36.0)
MCV: 97.4 fL (ref 80.0–100.0)
MCV: 97.1 fL (ref 80.0–100.0)
Platelets: 199 10*3/uL (ref 150–400)
Platelets: 114 10*3/uL — ABNORMAL LOW (ref 150–400)
RBC: 4.3 MIL/uL (ref 3.87–5.11)
RBC: 4.8 MIL/uL (ref 3.87–5.11)
RDW: 13.2 % (ref 11.5–15.5)
RDW: 13.2 % (ref 11.5–15.5)
WBC: 8.4 10*3/uL (ref 4.0–10.5)
WBC: 17.6 10*3/uL — ABNORMAL HIGH (ref 4.0–10.5)
nRBC: 0 % (ref 0.0–0.2)
nRBC: 0 % (ref 0.0–0.2)

## 2021-06-10 LAB — PROTIME-INR
INR: 1.1 (ref 0.8–1.2)
Prothrombin Time: 14.3 seconds (ref 11.4–15.2)

## 2021-06-10 LAB — RESP PANEL BY RT-PCR (FLU A&B, COVID) ARPGX2
Influenza A by PCR: NEGATIVE
Influenza B by PCR: NEGATIVE
SARS Coronavirus 2 by RT PCR: NEGATIVE

## 2021-06-10 LAB — SAMPLE TO BLOOD BANK

## 2021-06-10 LAB — TROPONIN I (HIGH SENSITIVITY)
Troponin I (High Sensitivity): 6 ng/L (ref <18)
Troponin I (High Sensitivity): 7 ng/L (ref <18)

## 2021-06-10 LAB — ETHANOL: Alcohol, Ethyl (B): <10 mg/dL (ref <10)

## 2021-06-10 LAB — LACTIC ACID, PLASMA: Lactic Acid, Venous: 2.6 mmol/L (ref 0.5–1.9)

## 2021-06-10 LAB — MRSA NEXT GEN BY PCR, NASAL: MRSA by PCR Next Gen: NOT DETECTED

## 2021-06-10 MED ORDER — ONDANSETRON HCL 4 MG/2ML IJ SOLN: 4.0000 mg | Freq: Four times a day (QID) | INTRAMUSCULAR | Status: DC | PRN

## 2021-06-10 MED ORDER — DIAZEPAM 5 MG PO TABS
5.0000 mg | ORAL_TABLET | Freq: Two times a day (BID) | ORAL | Status: DC
  Administered 2021-06-10 – 2021-06-16 (×12): 5 mg via ORAL
  Filled 2021-06-10 (×12): qty 1

## 2021-06-10 MED ORDER — PROPOFOL 10 MG/ML IV BOLUS
200.0000 mg | Freq: Once | INTRAVENOUS | Status: AC
  Administered 2021-06-10: 50 mg via INTRAVENOUS
  Filled 2021-06-10: qty 20

## 2021-06-10 MED ORDER — ONDANSETRON 4 MG PO TBDP: 4.0000 mg | ORAL_TABLET | Freq: Four times a day (QID) | ORAL | Status: DC | PRN

## 2021-06-10 MED ORDER — ORAL CARE MOUTH RINSE
15.0000 mL | Freq: Two times a day (BID) | OROMUCOSAL | Status: DC
  Administered 2021-06-11 – 2021-06-16 (×12): 15 mL via OROMUCOSAL

## 2021-06-10 MED ORDER — TRAMADOL HCL 50 MG PO TABS
50.0000 mg | ORAL_TABLET | Freq: Four times a day (QID) | ORAL | Status: DC | PRN
  Administered 2021-06-11 (×3): 50 mg via ORAL
  Filled 2021-06-10 (×3): qty 1

## 2021-06-10 MED ORDER — ACETAMINOPHEN 325 MG PO TABS
650.0000 mg | ORAL_TABLET | ORAL | Status: DC | PRN
  Administered 2021-06-10 – 2021-06-11 (×2): 650 mg via ORAL
  Filled 2021-06-10: qty 2

## 2021-06-10 MED ORDER — ONDANSETRON HCL 4 MG/2ML IJ SOLN
4.0000 mg | Freq: Once | INTRAMUSCULAR | Status: AC
  Administered 2021-06-10: 4 mg via INTRAVENOUS
  Filled 2021-06-10: qty 2

## 2021-06-10 MED ORDER — FENTANYL CITRATE PF 50 MCG/ML IJ SOSY
50.0000 ug | PREFILLED_SYRINGE | Freq: Once | INTRAMUSCULAR | Status: AC
  Administered 2021-06-10: 50 ug via INTRAVENOUS
  Filled 2021-06-10: qty 1

## 2021-06-10 MED ORDER — HYDROMORPHONE HCL 1 MG/ML IJ SOLN
0.5000 mg | INTRAMUSCULAR | Status: DC | PRN
  Administered 2021-06-10: 0.5 mg via INTRAVENOUS
  Filled 2021-06-10: qty 1

## 2021-06-10 MED ORDER — DEXTROSE 5 % IV SOLN
500.0000 mg | Freq: Three times a day (TID) | INTRAVENOUS | Status: DC | PRN
  Filled 2021-06-10: qty 5

## 2021-06-10 MED ORDER — PROPOFOL 10 MG/ML IV BOLUS
INTRAVENOUS | Status: AC | PRN
  Administered 2021-06-10: 30 mg via INTRAVENOUS

## 2021-06-10 MED ORDER — IOHEXOL 300 MG/ML  SOLN
100.0000 mL | Freq: Once | INTRAMUSCULAR | Status: AC | PRN
  Administered 2021-06-10: 100 mL via INTRAVENOUS

## 2021-06-10 MED ORDER — FENTANYL CITRATE (PF) 100 MCG/2ML IJ SOLN
INTRAMUSCULAR | Status: AC
  Administered 2021-06-10: 50 ug
  Filled 2021-06-10: qty 2

## 2021-06-10 MED ORDER — LACTATED RINGERS IV SOLN: INTRAVENOUS | Status: DC

## 2021-06-10 MED ORDER — LIDOCAINE HCL (PF) 1 % IJ SOLN
INTRAMUSCULAR | Status: AC
  Filled 2021-06-10: qty 30

## 2021-06-10 MED ORDER — PROPOFOL 500 MG/50ML IV EMUL
INTRAVENOUS | Status: AC | PRN
Start: 2021-06-10 — End: 2021-06-10
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
## 2021-06-10 NOTE — ED Notes (Signed)
Patient transported to CT 
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
## 2021-06-10 NOTE — ED Notes (Signed)
Ortho at bedside.
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
## 2021-06-10 NOTE — ED Notes (Signed)
This RN arrived and received report on patient.  Patient currently alert and oriented x 4, c/o 10/10 pelvic pain.  Patient has bilateral pedal pulses intact with mottling noted to both lower extremities.  Pelvic binder placed by EDP and TRN.  Patient then taken to CT. ?
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
## 2021-06-10 NOTE — Progress Notes (Signed)
Received pt from the ED, alert and oriented, cell phone and personal clothes noted at bedside, call light in reach, all questions answered ?

## 2021-06-10 NOTE — Procedures (Signed)
The patient was identified in the emergency room.  Timeout was performed according to universal protocol with nursing.  The correct right side was identified again according to universal protocol.  The distal femur was sterilized in the usual fashion.  A traction wire was then placed from lateral to medial under x-ray guidance.  This was confirmed to be in good placement on x-ray.  The ends of the wire were cut to be smooth and a Kirschner bow was placed.  20 pounds of traction was held on the Kirschner bow.  Following this x-ray was taken of the AP pelvis with improved elevation of the right hemipelvis. ?

## 2021-06-10 NOTE — Progress Notes (Signed)
Orthopedic Tech Progress Note ?Patient Details:  ?[name] ?17-Dec-1945 ?[id number] ? ?Musculoskeletal Traction ?Type of Traction: Skeletal (Balanced Suspension) ?Traction Location: rle ?Traction Weight: 20 lbs ? I assisted ortho Dr with traction application. ?Post Interventions ?Patient Tolerated: Well ?Instructions Provided: Care of device ? ?Trinna Post ?06/10/2021, 8:36 PM ? ?
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
## 2021-06-11 ENCOUNTER — Inpatient Hospital Stay (HOSPITAL_COMMUNITY): Payer: Medicare Other

## 2021-06-11 LAB — BASIC METABOLIC PANEL
Anion gap: 6 (ref 5–15)
BUN: 11 mg/dL (ref 8–23)
CO2: 27 mmol/L (ref 22–32)
Calcium: 8.4 mg/dL — ABNORMAL LOW (ref 8.9–10.3)
Chloride: 104 mmol/L (ref 98–111)
Creatinine, Ser: 0.84 mg/dL (ref 0.44–1.00)
GFR, Estimated: >60 mL/min (ref >60)
Glucose, Bld: 122 mg/dL — ABNORMAL HIGH (ref 70–99)
Potassium: 3.6 mmol/L (ref 3.5–5.1)
Sodium: 137 mmol/L (ref 135–145)

## 2021-06-11 LAB — CBC
HCT: 34.3 % — ABNORMAL LOW (ref 36.0–46.0)
HCT: 32.6 % — ABNORMAL LOW (ref 36.0–46.0)
Hemoglobin: 10.9 g/dL — ABNORMAL LOW (ref 12.0–15.0)
Hemoglobin: 10.7 g/dL — ABNORMAL LOW (ref 12.0–15.0)
MCH: 30.4 pg (ref 26.0–34.0)
MCH: 31.4 pg (ref 26.0–34.0)
MCHC: 31.8 g/dL (ref 30.0–36.0)
MCHC: 32.8 g/dL (ref 30.0–36.0)
MCV: 95.8 fL (ref 80.0–100.0)
MCV: 95.6 fL (ref 80.0–100.0)
Platelets: 170 10*3/uL (ref 150–400)
Platelets: 156 10*3/uL (ref 150–400)
RBC: 3.58 MIL/uL — ABNORMAL LOW (ref 3.87–5.11)
RBC: 3.41 MIL/uL — ABNORMAL LOW (ref 3.87–5.11)
RDW: 13.2 % (ref 11.5–15.5)
RDW: 13.3 % (ref 11.5–15.5)
WBC: 8.6 10*3/uL (ref 4.0–10.5)
WBC: 7 10*3/uL (ref 4.0–10.5)
nRBC: 0 % (ref 0.0–0.2)
nRBC: 0 % (ref 0.0–0.2)

## 2021-06-11 MED ORDER — CHLORHEXIDINE GLUCONATE CLOTH 2 % EX PADS
6.0000 | MEDICATED_PAD | Freq: Every day | CUTANEOUS | Status: DC
  Administered 2021-06-12 – 2021-06-13 (×2): 6 via TOPICAL

## 2021-06-11 MED ORDER — METOPROLOL SUCCINATE ER 25 MG PO TB24
25.0000 mg | ORAL_TABLET | Freq: Every day | ORAL | Status: DC
  Administered 2021-06-11 – 2021-06-12 (×2): 25 mg via ORAL
  Filled 2021-06-11 (×2): qty 1

## 2021-06-11 MED ORDER — WHITE PETROLATUM EX OINT
TOPICAL_OINTMENT | CUTANEOUS | Status: DC | PRN
  Filled 2021-06-11: qty 28.35

## 2021-06-11 MED ORDER — LACTATED RINGERS IV BOLUS: 500.0000 mL | Freq: Once | INTRAVENOUS | Status: AC

## 2021-06-11 MED ORDER — ACETAMINOPHEN 500 MG PO TABS
1000.0000 mg | ORAL_TABLET | Freq: Four times a day (QID) | ORAL | Status: DC
  Administered 2021-06-11 – 2021-06-16 (×18): 1000 mg via ORAL
  Filled 2021-06-11 (×18): qty 2

## 2021-06-11 NOTE — Progress Notes (Signed)
? ?  Subjective: ? ?Patient reports pain as mild this AM.  Reports that she feels blessed regarding her care so far.  Overall she is looking forward to definitive surgical fixation.  Denies any numbness in the right leg.  On secondary questioning she denies any pain in the remainder of her extremities.  Just some pain in the right groin. ? ? ?Objective:  ? ?VITALS:   ?Vitals:  ? 06/11/21 0200 06/11/21 0300 06/11/21 0400 06/11/21 0500  ?BP: 114/67 131/69 139/78 (!) 119/59  ?Pulse: 82 79 84 79  ?Resp: 15 13 13 13   ?Temp:   98.5 ?F (36.9 ?C)   ?TempSrc:   Oral   ?SpO2: 99% 99% 99% 96%  ?Weight:      ?Height:      ? ? ?Right lower leg is in traction.  Traction bow is off of the skin and not irritating the contralateral leg.  Sensation is intact in all distributions of the right foot.  She is able to dorsiflex the right foot as well as fire EHL.  Strong palpable 2+ dorsalis pedis pulse ? ? ?Lab Results  ?Component Value Date  ? WBC 8.6 06/11/2021  ? HGB 10.9 (L) 06/11/2021  ? HCT 34.3 (L) 06/11/2021  ? MCV 95.8 06/11/2021  ? PLT 170 06/11/2021  ? ? ? ?Assessment/Plan: ? ?76 year old female with a right lateral compression type pelvic injury subsequently placed in distal femoral traction with improvement in alignment.  There is a loosely applied binder as well.  This is pending operative fixation with Dr. 70 on 5/15.  I have requested his specific expert consultation in regards to pelvic surgical intervention. ? ?- Expected postop acute blood loss anemia - will monitor for symptoms ?- Continue traction distal femoral 20 pounds ?- Nonweightbearing right lower extremity in traction ?- Please plan for n.p.o. at midnight for definitive fixation 5/15 with Dr. 6/15 ?-Appreciate excellent care per trauma team ?Jena Gauss ?06/11/2021, 7:25 AM ? ?
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
## 2021-06-11 NOTE — Plan of Care (Signed)
  Problem: Clinical Measurements: Goal: Ability to maintain clinical measurements within normal limits will improve Outcome: Progressing   Problem: Coping: Goal: Level of anxiety will decrease Outcome: Progressing   Problem: Elimination: Goal: Will not experience complications related to urinary retention Outcome: Progressing   

## 2021-06-12 ENCOUNTER — Encounter (HOSPITAL_COMMUNITY): Admission: EM | Disposition: A | Discharge status: Rehabilitation Facility | Payer: Self-pay | Source: Home / Self Care

## 2021-06-12 ENCOUNTER — Encounter (HOSPITAL_COMMUNITY): Payer: Self-pay | Admitting: General Surgery

## 2021-06-12 ENCOUNTER — Inpatient Hospital Stay (HOSPITAL_COMMUNITY): Payer: Medicare Other | Admitting: Anesthesiology

## 2021-06-12 ENCOUNTER — Inpatient Hospital Stay (HOSPITAL_COMMUNITY): Payer: Medicare Other

## 2021-06-12 DIAGNOSIS — S332XXA Dislocation of sacroiliac and sacrococcygeal joint, initial encounter: Secondary | ICD-10-CM

## 2021-06-12 DIAGNOSIS — Z472 Encounter for removal of internal fixation device: Secondary | ICD-10-CM

## 2021-06-12 DIAGNOSIS — S37899A Unspecified injury of other urinary and pelvic organ, initial encounter: Secondary | ICD-10-CM

## 2021-06-12 HISTORY — PX: ORIF PELVIC FRACTURE: SHX2128

## 2021-06-12 LAB — BASIC METABOLIC PANEL
Anion gap: 7 (ref 5–15)
BUN: 10 mg/dL (ref 8–23)
CO2: 26 mmol/L (ref 22–32)
Calcium: 8.2 mg/dL — ABNORMAL LOW (ref 8.9–10.3)
Chloride: 105 mmol/L (ref 98–111)
Creatinine, Ser: 0.74 mg/dL (ref 0.44–1.00)
GFR, Estimated: >60 mL/min (ref >60)
Glucose, Bld: 101 mg/dL — ABNORMAL HIGH (ref 70–99)
Potassium: 3.6 mmol/L (ref 3.5–5.1)
Sodium: 138 mmol/L (ref 135–145)

## 2021-06-12 LAB — TYPE AND SCREEN
ABO/RH(D): O POS
Antibody Screen: NEGATIVE

## 2021-06-12 LAB — ABO/RH: ABO/RH(D): O POS

## 2021-06-12 SURGERY — OPEN REDUCTION INTERNAL FIXATION (ORIF) PELVIC FRACTURE
Anesthesia: General | Site: Pelvis | Laterality: Left

## 2021-06-12 MED ORDER — TRANEXAMIC ACID-NACL 1000-0.7 MG/100ML-% IV SOLN
1000.0000 mg | INTRAVENOUS | Status: AC
  Administered 2021-06-12: 1000 mg via INTRAVENOUS

## 2021-06-12 MED ORDER — METHOCARBAMOL 500 MG PO TABS
500.0000 mg | ORAL_TABLET | Freq: Three times a day (TID) | ORAL | Status: DC | PRN
  Administered 2021-06-13 – 2021-06-16 (×3): 500 mg via ORAL
  Filled 2021-06-12 (×3): qty 1

## 2021-06-12 MED ORDER — DEXAMETHASONE SODIUM PHOSPHATE 10 MG/ML IJ SOLN
INTRAMUSCULAR | Status: DC | PRN
Start: 2021-06-12 — End: 2021-06-12
  Administered 2021-06-12: 4 mg via INTRAVENOUS

## 2021-06-12 MED ORDER — ATORVASTATIN CALCIUM 10 MG PO TABS
20.0000 mg | ORAL_TABLET | Freq: Every day | ORAL | Status: DC
  Filled 2021-06-12: qty 2

## 2021-06-12 MED ORDER — CHLORHEXIDINE GLUCONATE 0.12 % MT SOLN
OROMUCOSAL | Status: AC
  Administered 2021-06-12: 15 mL
  Filled 2021-06-12: qty 15

## 2021-06-12 MED ORDER — POVIDONE-IODINE 10 % EX SWAB
2.0000 "application " | Freq: Once | CUTANEOUS | Status: AC
  Administered 2021-06-12: 2 via TOPICAL

## 2021-06-12 MED ORDER — ONDANSETRON HCL 4 MG/2ML IJ SOLN
INTRAMUSCULAR | Status: AC
  Filled 2021-06-12: qty 2

## 2021-06-12 MED ORDER — POLYETHYLENE GLYCOL 3350 17 G PO PACK: 17.0000 g | PACK | Freq: Every day | ORAL | Status: DC | PRN

## 2021-06-12 MED ORDER — PHENYLEPHRINE 80 MCG/ML (10ML) SYRINGE FOR IV PUSH (FOR BLOOD PRESSURE SUPPORT)
PREFILLED_SYRINGE | INTRAVENOUS | Status: AC
  Filled 2021-06-12: qty 10

## 2021-06-12 MED ORDER — SUGAMMADEX SODIUM 200 MG/2ML IV SOLN
INTRAVENOUS | Status: DC | PRN
  Administered 2021-06-12: 300 mg via INTRAVENOUS

## 2021-06-12 MED ORDER — HYDROCHLOROTHIAZIDE 12.5 MG PO TABS
12.5000 mg | ORAL_TABLET | Freq: Every day | ORAL | Status: DC
  Administered 2021-06-12: 12.5 mg via ORAL
  Filled 2021-06-12 (×2): qty 1

## 2021-06-12 MED ORDER — VANCOMYCIN HCL IN DEXTROSE 1-5 GM/200ML-% IV SOLN
1000.0000 mg | Freq: Two times a day (BID) | INTRAVENOUS | Status: AC
  Administered 2021-06-12: 1000 mg via INTRAVENOUS
  Filled 2021-06-12: qty 200

## 2021-06-12 MED ORDER — ACETAMINOPHEN 10 MG/ML IV SOLN
1000.0000 mg | Freq: Once | INTRAVENOUS | Status: AC
  Administered 2021-06-12: 1000 mg via INTRAVENOUS

## 2021-06-12 MED ORDER — OXYCODONE HCL 5 MG/5ML PO SOLN: 5.0000 mg | Freq: Once | ORAL | Status: DC | PRN

## 2021-06-12 MED ORDER — OXYCODONE HCL 5 MG PO TABS: 5.0000 mg | ORAL_TABLET | Freq: Once | ORAL | Status: DC | PRN

## 2021-06-12 MED ORDER — ONDANSETRON HCL 4 MG PO TABS: 4.0000 mg | ORAL_TABLET | Freq: Four times a day (QID) | ORAL | Status: DC | PRN

## 2021-06-12 MED ORDER — PHENYLEPHRINE 80 MCG/ML (10ML) SYRINGE FOR IV PUSH (FOR BLOOD PRESSURE SUPPORT)
PREFILLED_SYRINGE | INTRAVENOUS | Status: DC | PRN
  Administered 2021-06-12: 160 ug via INTRAVENOUS
  Administered 2021-06-12 (×2): 80 ug via INTRAVENOUS

## 2021-06-12 MED ORDER — PROPOFOL 10 MG/ML IV BOLUS
INTRAVENOUS | Status: DC | PRN
  Administered 2021-06-12: 20 mg via INTRAVENOUS
  Administered 2021-06-12: 40 mg via INTRAVENOUS
  Administered 2021-06-12: 120 mg via INTRAVENOUS
  Administered 2021-06-12: 20 mg via INTRAVENOUS

## 2021-06-12 MED ORDER — FENTANYL CITRATE (PF) 100 MCG/2ML IJ SOLN
25.0000 ug | INTRAMUSCULAR | Status: DC | PRN
  Administered 2021-06-12 (×2): 50 ug via INTRAVENOUS

## 2021-06-12 MED ORDER — VANCOMYCIN HCL IN DEXTROSE 1-5 GM/200ML-% IV SOLN
INTRAVENOUS | Status: AC
  Administered 2021-06-12: 1000 mg via INTRAVENOUS
  Filled 2021-06-12: qty 200

## 2021-06-12 MED ORDER — DEXAMETHASONE SODIUM PHOSPHATE 10 MG/ML IJ SOLN
INTRAMUSCULAR | Status: AC
  Filled 2021-06-12: qty 1

## 2021-06-12 MED ORDER — PHENYLEPHRINE HCL-NACL 20-0.9 MG/250ML-% IV SOLN
INTRAVENOUS | Status: DC | PRN
  Administered 2021-06-12: 25 ug/min via INTRAVENOUS

## 2021-06-12 MED ORDER — LIDOCAINE 2% (20 MG/ML) 5 ML SYRINGE
INTRAMUSCULAR | Status: AC
  Filled 2021-06-12: qty 10

## 2021-06-12 MED ORDER — EPHEDRINE 5 MG/ML INJ
INTRAVENOUS | Status: AC
  Filled 2021-06-12: qty 5

## 2021-06-12 MED ORDER — 0.9 % SODIUM CHLORIDE (POUR BTL) OPTIME
TOPICAL | Status: DC | PRN
  Administered 2021-06-12: 1000 mL

## 2021-06-12 MED ORDER — ONDANSETRON HCL 4 MG/2ML IJ SOLN
INTRAMUSCULAR | Status: DC | PRN
  Administered 2021-06-12: 4 mg via INTRAVENOUS

## 2021-06-12 MED ORDER — METOCLOPRAMIDE HCL 5 MG PO TABS: 5.0000 mg | ORAL_TABLET | Freq: Three times a day (TID) | ORAL | Status: DC | PRN

## 2021-06-12 MED ORDER — ONDANSETRON HCL 4 MG/2ML IJ SOLN: 4.0000 mg | Freq: Four times a day (QID) | INTRAMUSCULAR | Status: DC | PRN

## 2021-06-12 MED ORDER — ONDANSETRON HCL 4 MG/2ML IJ SOLN
4.0000 mg | Freq: Once | INTRAMUSCULAR | Status: AC | PRN
  Administered 2021-06-12: 4 mg via INTRAVENOUS

## 2021-06-12 MED ORDER — ROCURONIUM BROMIDE 10 MG/ML (PF) SYRINGE
PREFILLED_SYRINGE | INTRAVENOUS | Status: AC
  Filled 2021-06-12: qty 40

## 2021-06-12 MED ORDER — TRANEXAMIC ACID-NACL 1000-0.7 MG/100ML-% IV SOLN
1000.0000 mg | Freq: Once | INTRAVENOUS | Status: AC
  Administered 2021-06-12: 1000 mg via INTRAVENOUS
  Filled 2021-06-12: qty 100

## 2021-06-12 MED ORDER — FENTANYL CITRATE (PF) 100 MCG/2ML IJ SOLN
INTRAMUSCULAR | Status: AC
  Filled 2021-06-12: qty 2

## 2021-06-12 MED ORDER — METOCLOPRAMIDE HCL 5 MG/ML IJ SOLN: 5.0000 mg | Freq: Three times a day (TID) | INTRAMUSCULAR | Status: DC | PRN

## 2021-06-12 MED ORDER — FENTANYL CITRATE (PF) 250 MCG/5ML IJ SOLN
INTRAMUSCULAR | Status: AC
  Filled 2021-06-12: qty 5

## 2021-06-12 MED ORDER — LIDOCAINE 2% (20 MG/ML) 5 ML SYRINGE
INTRAMUSCULAR | Status: DC | PRN
  Administered 2021-06-12: 50 mg via INTRAVENOUS

## 2021-06-12 MED ORDER — HYDROMORPHONE HCL 1 MG/ML IJ SOLN
0.5000 mg | INTRAMUSCULAR | Status: DC | PRN
  Administered 2021-06-12 – 2021-06-15 (×2): 0.5 mg via INTRAVENOUS
  Filled 2021-06-12: qty 1
  Filled 2021-06-12: qty 0.5

## 2021-06-12 MED ORDER — PROPOFOL 10 MG/ML IV BOLUS
INTRAVENOUS | Status: AC
  Filled 2021-06-12: qty 20

## 2021-06-12 MED ORDER — TRAMADOL HCL 50 MG PO TABS
50.0000 mg | ORAL_TABLET | Freq: Four times a day (QID) | ORAL | Status: DC | PRN
  Administered 2021-06-13 – 2021-06-16 (×4): 100 mg via ORAL
  Filled 2021-06-12 (×4): qty 2

## 2021-06-12 MED ORDER — DOCUSATE SODIUM 100 MG PO CAPS
100.0000 mg | ORAL_CAPSULE | Freq: Two times a day (BID) | ORAL | Status: DC
  Administered 2021-06-13 – 2021-06-16 (×7): 100 mg via ORAL
  Filled 2021-06-12 (×7): qty 1

## 2021-06-12 MED ORDER — LOSARTAN POTASSIUM 50 MG PO TABS
100.0000 mg | ORAL_TABLET | Freq: Every day | ORAL | Status: DC
  Administered 2021-06-12: 100 mg via ORAL
  Filled 2021-06-12 (×2): qty 2

## 2021-06-12 MED ORDER — VANCOMYCIN HCL IN DEXTROSE 1-5 GM/200ML-% IV SOLN: 1000.0000 mg | INTRAVENOUS | Status: AC

## 2021-06-12 MED ORDER — METHOCARBAMOL 1000 MG/10ML IJ SOLN
500.0000 mg | Freq: Three times a day (TID) | INTRAVENOUS | Status: DC | PRN
  Filled 2021-06-12: qty 5

## 2021-06-12 MED ORDER — ROCURONIUM BROMIDE 10 MG/ML (PF) SYRINGE
PREFILLED_SYRINGE | INTRAVENOUS | Status: DC | PRN
Start: 2021-06-12 — End: 2021-06-12
  Administered 2021-06-12: 10 mg via INTRAVENOUS
  Administered 2021-06-12: 70 mg via INTRAVENOUS

## 2021-06-12 MED ORDER — FENTANYL CITRATE (PF) 250 MCG/5ML IJ SOLN
INTRAMUSCULAR | Status: DC | PRN
  Administered 2021-06-12 (×5): 50 ug via INTRAVENOUS

## 2021-06-12 MED ORDER — VANCOMYCIN HCL 1000 MG IV SOLR
INTRAVENOUS | Status: AC
  Filled 2021-06-12: qty 20

## 2021-06-12 MED ORDER — CHLORHEXIDINE GLUCONATE 4 % EX LIQD
60.0000 mL | Freq: Once | CUTANEOUS | Status: DC
  Filled 2021-06-12: qty 60

## 2021-06-12 MED ORDER — LOSARTAN POTASSIUM-HCTZ 100-12.5 MG PO TABS: 1.0000 | ORAL_TABLET | Freq: Every day | ORAL | Status: DC

## 2021-06-12 MED ORDER — ACETAMINOPHEN 10 MG/ML IV SOLN
INTRAVENOUS | Status: AC
  Filled 2021-06-12: qty 100

## 2021-06-12 MED ORDER — TRANEXAMIC ACID-NACL 1000-0.7 MG/100ML-% IV SOLN
INTRAVENOUS | Status: AC
  Filled 2021-06-12: qty 100

## 2021-06-12 SURGICAL SUPPLY — 50 items
ADH SKN CLS APL DERMABOND .7 (GAUZE/BANDAGES/DRESSINGS) ×2
APL PRP STRL LF DISP 70% ISPRP (MISCELLANEOUS) ×1
BIT DRILL CANN 4.5MM (BIT) IMPLANT
CHLORAPREP W/TINT 26 (MISCELLANEOUS) ×2 IMPLANT
DERMABOND ADVANCED (GAUZE/BANDAGES/DRESSINGS) ×2
DERMABOND ADVANCED .7 DNX12 (GAUZE/BANDAGES/DRESSINGS) ×2 IMPLANT
DRAPE C-ARM 42X72 X-RAY (DRAPES) ×2 IMPLANT
DRAPE C-ARMOR (DRAPES) ×2 IMPLANT
DRAPE INCISE IOBAN 66X45 STRL (DRAPES) ×4 IMPLANT
DRAPE SURG 17X23 STRL (DRAPES) ×8 IMPLANT
DRAPE U-SHAPE 47X51 STRL (DRAPES) ×2 IMPLANT
DRESSING MEPILEX FLEX 4X4 (GAUZE/BANDAGES/DRESSINGS) IMPLANT
DRILL BIT CANN 4.5MM (BIT) ×1
DRSG MEPILEX BORDER 4X4 (GAUZE/BANDAGES/DRESSINGS) ×3 IMPLANT
DRSG MEPILEX BORDER 4X8 (GAUZE/BANDAGES/DRESSINGS) ×3 IMPLANT
DRSG MEPILEX FLEX 4X4 (GAUZE/BANDAGES/DRESSINGS) ×2
DRSG TEGADERM 4X4.5 CHG (GAUZE/BANDAGES/DRESSINGS) ×1 IMPLANT
ELECT REM PT RETURN 9FT ADLT (ELECTROSURGICAL) ×2
ELECTRODE REM PT RTRN 9FT ADLT (ELECTROSURGICAL) ×1 IMPLANT
GLOVE BIO SURGEON STRL SZ 6.5 (GLOVE) ×6 IMPLANT
GLOVE BIO SURGEON STRL SZ7.5 (GLOVE) ×8 IMPLANT
GLOVE BIOGEL PI IND STRL 6.5 (GLOVE) ×1 IMPLANT
GLOVE BIOGEL PI IND STRL 7.5 (GLOVE) ×1 IMPLANT
GLOVE BIOGEL PI INDICATOR 6.5 (GLOVE) ×1
GLOVE BIOGEL PI INDICATOR 7.5 (GLOVE) ×1
GOWN STRL REUS W/ TWL LRG LVL3 (GOWN DISPOSABLE) ×2 IMPLANT
GOWN STRL REUS W/TWL LRG LVL3 (GOWN DISPOSABLE) ×4
GUIDEWIRE 2.0MM (WIRE) ×2 IMPLANT
GUIDEWIRE THREADED 2.8MM (WIRE) ×3 IMPLANT
KIT BASIN OR (CUSTOM PROCEDURE TRAY) ×2 IMPLANT
KIT TURNOVER KIT B (KITS) ×2 IMPLANT
NS IRRIG 1000ML POUR BTL (IV SOLUTION) ×4 IMPLANT
PACK TOTAL JOINT (CUSTOM PROCEDURE TRAY) ×2 IMPLANT
PACK UNIVERSAL I (CUSTOM PROCEDURE TRAY) ×2 IMPLANT
PAD ARMBOARD 7.5X6 YLW CONV (MISCELLANEOUS) ×4 IMPLANT
SCREW CANN 6.5X120X32 (Screw) ×1 IMPLANT
SCREW CANN 7.3X145X32 THR (Screw) ×1 IMPLANT
SCREW CANN F/THREAD 6.5X75 (Screw) ×1 IMPLANT
SPONGE T-LAP 18X18 ~~LOC~~+RFID (SPONGE) ×1 IMPLANT
STAPLER VISISTAT 35W (STAPLE) ×2 IMPLANT
SUCTION FRAZIER HANDLE 10FR (MISCELLANEOUS) ×2
SUCTION TUBE FRAZIER 10FR DISP (MISCELLANEOUS) ×1 IMPLANT
SUT MNCRL AB 3-0 PS2 18 (SUTURE) ×2 IMPLANT
SUT VIC AB 0 CT1 27 (SUTURE) ×4
SUT VIC AB 0 CT1 27XBRD ANBCTR (SUTURE) ×2 IMPLANT
SUT VIC AB 2-0 CT1 27 (SUTURE) ×4
SUT VIC AB 2-0 CT1 TAPERPNT 27 (SUTURE) ×2 IMPLANT
TOWEL GREEN STERILE (TOWEL DISPOSABLE) ×4 IMPLANT
TOWEL GREEN STERILE FF (TOWEL DISPOSABLE) ×2 IMPLANT
WASHER FOR 5.0 SCREWS (Washer) ×2 IMPLANT
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
## 2021-06-12 NOTE — Transfer of Care (Signed)
Immediate Anesthesia Transfer of Care Note ? ?Patient: Zoe Arnold ? ?Procedure(s) Performed: OPEN REDUCTION INTERNAL FIXATION (ORIF) PELVIC FRACTURE (Left: Pelvis) ? ?Patient Location: PACU ? ?Anesthesia Type:General ? ?Level of Consciousness: drowsy and patient cooperative ? ?Airway & Oxygen Therapy: Patient Spontanous Breathing and Patient connected to nasal cannula oxygen ? ?Post-op Assessment: Report given to RN, Post -op Vital signs reviewed and stable and Patient moving all extremities ? ?Post vital signs: Reviewed and stable ? ?Last Vitals:  ?Vitals Value Taken Time  ?BP 157/94 06/12/21 1711  ?Temp    ?Pulse 95 06/12/21 1712  ?Resp 20 06/12/21 1713  ?SpO2 100 % 06/12/21 1712  ?Vitals shown include unvalidated device data. ? ?Last Pain:  ?Vitals:  ? 06/12/21 1146  ?TempSrc:   ?PainSc: 3   ?   ? ?  ? ?Complications: No notable events documented. ?

## 2021-06-12 NOTE — Anesthesia Procedure Notes (Signed)
Procedure Name: Intubation ?Date/Time: 06/12/2021 3:09 PM ?Performed by: Wilburn Cornelia, CRNA ?Pre-anesthesia Checklist: Patient identified, Emergency Drugs available, Suction available, Patient being monitored and Timeout performed ?Patient Re-evaluated:Patient Re-evaluated prior to induction ?Oxygen Delivery Method: Circle system utilized ?Preoxygenation: Pre-oxygenation with 100% oxygen ?Induction Type: IV induction ?Ventilation: Mask ventilation without difficulty ?Laryngoscope Size: Mac and 3 ?Grade View: Grade IV ?Tube type: Oral ?Tube size: 7.0 mm ?Number of attempts: 1 ?Airway Equipment and Method: Stylet ?Placement Confirmation: ETT inserted through vocal cords under direct vision, positive ETCO2, CO2 detector and breath sounds checked- equal and bilateral ?Secured at: 21 cm ?Tube secured with: Tape ?Dental Injury: Teeth and Oropharynx as per pre-operative assessment  ? ? ? ? ?
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
## 2021-06-12 NOTE — Anesthesia Preprocedure Evaluation (Addendum)
Anesthesia Evaluation  ?Patient identified by MRN, date of birth, ID band ?Patient awake ? ? ? ?Reviewed: ?Allergy & Precautions, NPO status , Patient's Chart, lab work & pertinent test results, reviewed documented beta blocker date and time  ? ?History of Anesthesia Complications ?Negative for: history of anesthetic complications ? ?Airway ?Mallampati: III ? ?TM Distance: >3 FB ?Neck ROM: Full ? ? ? Dental ? ?(+) Dental Advisory Given ?  ?Pulmonary ?neg pulmonary ROS, former smoker,  ?  ?Pulmonary exam normal ? ? ? ? ? ? ? Cardiovascular ?hypertension, Pt. on home beta blockers and Pt. on medications ?Normal cardiovascular exam ? ? ?  ?Neuro/Psych ?negative neurological ROS ? negative psych ROS  ? GI/Hepatic ?Neg liver ROS, GERD  Controlled,  ?Endo/Other  ? ?Obesity ? ? Renal/GU ?negative Renal ROS  ? ?  ?Musculoskeletal ?negative musculoskeletal ROS ?(+)  ? Abdominal ?  ?Peds ? Hematology ? ?(+) Blood dyscrasia, anemia ,   ?Anesthesia Other Findings ? ? Reproductive/Obstetrics ? ?  ? ? ? ? ? ? ? ? ? ? ? ? ? ?  ?  ? ? ? ? ? ? ? ?Anesthesia Physical ?Anesthesia Plan ? ?ASA: 2 ? ?Anesthesia Plan: General  ? ?Post-op Pain Management: Tylenol PO (pre-op)*  ? ?Induction: Intravenous ? ?PONV Risk Score and Plan: 3 and Treatment may vary due to age or medical condition, Ondansetron and Dexamethasone ? ?Airway Management Planned: Oral ETT ? ?Additional Equipment: None ? ?Intra-op Plan:  ? ?Post-operative Plan: Extubation in OR ? ?Informed Consent: I have reviewed the patients History and Physical, chart, labs and discussed the procedure including the risks, benefits and alternatives for the proposed anesthesia with the patient or authorized representative who has indicated his/her understanding and acceptance.  ? ? ? ?Dental advisory given ? ?Plan Discussed with: CRNA and Anesthesiologist ? ?Anesthesia Plan Comments:   ? ? ? ? ? ?Anesthesia Quick Evaluation ? ?

## 2021-06-12 NOTE — TOC CAGE-AID Note (Signed)
Transition of Care (TOC) - CAGE-AID Screening ? ? ?Patient Details  ?Name: Zoe Arnold ?MRN: [id number] ?Date of Birth: 21-Nov-1945 ? ?Transition of Care (TOC) CM/SW Contact:    ?Verlon Pischke C Tarpley-Carter, LCSWA ?Phone Number: ?06/12/2021, 10:23 AM ? ? ?Clinical Narrative: ?Pt participated in Cage-Aid.  Pt stated she does not use substance or ETOH.  Pt was not offered resources, due to no usage of substance or ETOH.   ? ?Insurance underwriter, MSW, LCSW-A ?Pronouns:  She/Her/Hers ?[facility] of [facility] ?Clinical Social Worker ?Direct Number:  [phone] ?[email or web address] ? ?CAGE-AID Screening: ?  ? ?Have You Ever Felt You Ought to Cut Down on Your Drinking or Drug Use?: No ?Have People Annoyed You By Critizing Your Drinking Or Drug Use?: No ?Have You Felt Bad Or Guilty About Your Drinking Or Drug Use?: No ?Have You Ever Had a Drink or Used Drugs First Thing In The Morning to Steady Your Nerves or to Get Rid of a Hangover?: No ?CAGE-AID Score: 0 ? ?Substance Abuse Education Offered: No ? ?  ? ? ? ? ? ? ?

## 2021-06-12 NOTE — Progress Notes (Signed)
Patient had questions about surgery and did not want to consent until she spoke with the MD  ?

## 2021-06-12 NOTE — Interval H&P Note (Signed)
History and Physical Interval Note: ? ?06/12/2021 ?2:20 PM ? ?Zoe Arnold  has presented today for surgery, with the diagnosis of Pelvic fracture.  The various methods of treatment have been discussed with the patient and family. After consideration of risks, benefits and other options for treatment, the patient has consented to  Procedure(s): ?OPEN REDUCTION INTERNAL FIXATION (ORIF) PELVIC FRACTURE (Left) as a surgical intervention.  The patient's history has been reviewed, patient examined, no change in status, stable for surgery.  I have reviewed the patient's chart and labs.  Questions were answered to the patient's satisfaction.   ? ? ?Caryn Bee P Delinda Malan ? ? ?

## 2021-06-13 ENCOUNTER — Encounter (HOSPITAL_COMMUNITY): Payer: Self-pay | Admitting: Student

## 2021-06-13 ENCOUNTER — Inpatient Hospital Stay (HOSPITAL_COMMUNITY): Payer: Medicare Other

## 2021-06-13 LAB — BASIC METABOLIC PANEL
Anion gap: 6 (ref 5–15)
Anion gap: 8 (ref 5–15)
BUN: 7 mg/dL — ABNORMAL LOW (ref 8–23)
BUN: 9 mg/dL (ref 8–23)
CO2: 30 mmol/L (ref 22–32)
CO2: 30 mmol/L (ref 22–32)
Calcium: 8.3 mg/dL — ABNORMAL LOW (ref 8.9–10.3)
Calcium: 8.2 mg/dL — ABNORMAL LOW (ref 8.9–10.3)
Chloride: 102 mmol/L (ref 98–111)
Chloride: 99 mmol/L (ref 98–111)
Creatinine, Ser: 0.78 mg/dL (ref 0.44–1.00)
Creatinine, Ser: 0.75 mg/dL (ref 0.44–1.00)
GFR, Estimated: >60 mL/min (ref >60)
GFR, Estimated: >60 mL/min (ref >60)
Glucose, Bld: 125 mg/dL — ABNORMAL HIGH (ref 70–99)
Glucose, Bld: 116 mg/dL — ABNORMAL HIGH (ref 70–99)
Potassium: 4 mmol/L (ref 3.5–5.1)
Potassium: 3.8 mmol/L (ref 3.5–5.1)
Sodium: 138 mmol/L (ref 135–145)
Sodium: 137 mmol/L (ref 135–145)

## 2021-06-13 LAB — CBC
HCT: 31.3 % — ABNORMAL LOW (ref 36.0–46.0)
Hemoglobin: 10.1 g/dL — ABNORMAL LOW (ref 12.0–15.0)
MCH: 31.3 pg (ref 26.0–34.0)
MCHC: 32.3 g/dL (ref 30.0–36.0)
MCV: 96.9 fL (ref 80.0–100.0)
Platelets: 147 10*3/uL — ABNORMAL LOW (ref 150–400)
RBC: 3.23 MIL/uL — ABNORMAL LOW (ref 3.87–5.11)
RDW: 12.9 % (ref 11.5–15.5)
WBC: 8.9 10*3/uL (ref 4.0–10.5)
nRBC: 0 % (ref 0.0–0.2)

## 2021-06-13 LAB — VITAMIN D 25 HYDROXY (VIT D DEFICIENCY, FRACTURES): Vit D, 25-Hydroxy: 15.32 ng/mL — ABNORMAL LOW (ref 30–100)

## 2021-06-13 MED ORDER — METOPROLOL SUCCINATE ER 25 MG PO TB24
25.0000 mg | ORAL_TABLET | Freq: Every day | ORAL | Status: DC
Start: 2021-06-13 — End: 2021-06-16
  Administered 2021-06-13 – 2021-06-15 (×3): 25 mg via ORAL
  Filled 2021-06-13 (×3): qty 1

## 2021-06-13 MED ORDER — SODIUM CHLORIDE 0.9 % IV SOLN: 250.0000 mL | INTRAVENOUS | Status: DC | PRN

## 2021-06-13 MED ORDER — ATORVASTATIN CALCIUM 10 MG PO TABS
20.0000 mg | ORAL_TABLET | Freq: Every day | ORAL | Status: DC
Start: 2021-06-14 — End: 2021-06-16
  Administered 2021-06-14 – 2021-06-15 (×2): 20 mg via ORAL
  Filled 2021-06-13 (×2): qty 2

## 2021-06-13 MED ORDER — BETHANECHOL CHLORIDE 25 MG PO TABS
25.0000 mg | ORAL_TABLET | Freq: Three times a day (TID) | ORAL | Status: DC
  Administered 2021-06-13 – 2021-06-16 (×10): 25 mg via ORAL
  Filled 2021-06-13 (×10): qty 1

## 2021-06-13 MED ORDER — LOSARTAN POTASSIUM 50 MG PO TABS
100.0000 mg | ORAL_TABLET | Freq: Every day | ORAL | Status: DC
  Administered 2021-06-13 – 2021-06-15 (×3): 100 mg via ORAL
  Filled 2021-06-13 (×3): qty 2

## 2021-06-13 MED ORDER — SODIUM CHLORIDE 0.9% FLUSH: 3.0000 mL | INTRAVENOUS | Status: DC | PRN

## 2021-06-13 MED ORDER — VITAMIN D (ERGOCALCIFEROL) 1.25 MG (50000 UNIT) PO CAPS
50000.0000 [IU] | ORAL_CAPSULE | ORAL | Status: DC
  Administered 2021-06-13: 50000 [IU] via ORAL
  Filled 2021-06-13 (×2): qty 1

## 2021-06-13 MED ORDER — HYDROCHLOROTHIAZIDE 12.5 MG PO TABS
12.5000 mg | ORAL_TABLET | Freq: Every day | ORAL | Status: DC
  Administered 2021-06-13 – 2021-06-15 (×3): 12.5 mg via ORAL
  Filled 2021-06-13 (×3): qty 1

## 2021-06-13 MED ORDER — ENOXAPARIN SODIUM 30 MG/0.3ML IJ SOSY
30.0000 mg | PREFILLED_SYRINGE | Freq: Two times a day (BID) | INTRAMUSCULAR | Status: DC
  Administered 2021-06-13 – 2021-06-15 (×4): 30 mg via SUBCUTANEOUS
  Filled 2021-06-13 (×4): qty 0.3

## 2021-06-13 MED ORDER — SODIUM CHLORIDE 0.9% FLUSH
3.0000 mL | Freq: Two times a day (BID) | INTRAVENOUS | Status: DC
  Administered 2021-06-13 – 2021-06-16 (×7): 3 mL via INTRAVENOUS
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
## 2021-06-13 NOTE — Anesthesia Postprocedure Evaluation (Signed)
Anesthesia Post Note ? ?Patient: Zoe Arnold ? ?Procedure(s) Performed: OPEN REDUCTION INTERNAL FIXATION (ORIF) PELVIC FRACTURE (Left: Pelvis) ? ?  ? ?Patient location during evaluation: PACU ?Anesthesia Type: General ?Level of consciousness: awake and alert ?Pain management: pain level controlled ?Vital Signs Assessment: post-procedure vital signs reviewed and stable ?Respiratory status: spontaneous breathing, nonlabored ventilation, respiratory function stable and patient connected to nasal cannula oxygen ?Cardiovascular status: blood pressure returned to baseline and stable ?Postop Assessment: no apparent nausea or vomiting ?Anesthetic complications: no ? ? ?No notable events documented. ? ?Last Vitals:  ?Vitals:  ? 06/13/21 0300 06/13/21 0405  ?BP: 134/69 138/69  ?Pulse: 88 86  ?Resp: (!) 24 20  ?Temp:  37.2 ?C  ?SpO2: 100% 98%  ?  ?Last Pain:  ?Vitals:  ? 06/13/21 0405  ?TempSrc: Oral  ?PainSc: 0-No pain  ? ? ?  ?  ?  ?  ?  ?  ? ?Beryle Lathe ? ? ? ? ?

## 2021-06-13 NOTE — Progress Notes (Signed)
Orthopaedic Trauma Progress Note ? ?SUBJECTIVE: Doing fairly well this morning, pain much better than preoperatively.  Has not been up out of bed yet.  Denies any numbness or tingling throughout bilateral lower extremities. No chest pain. No SOB. No nausea/vomiting. No other complaints.  ? ?OBJECTIVE:  ?Vitals:  ? 06/13/21 0300 06/13/21 0405  ?BP: 134/69 138/69  ?Pulse: 88 86  ?Resp: (!) 24 20  ?Temp:  98.9 ?F (37.2 ?C)  ?SpO2: 100% 98%  ? ? ?General: Laying in bed comfortably, no acute distress ?Respiratory: No increased work of breathing.  ?Pelvis/right lower extremity: Dressings clean, dry, intact.  No significant tenderness over the hip or anteriorly over the pelvis.  Ankle DF/PF intact bilaterally.  Endorses sensation throughout extremity.  Neurovascularly intact ? ?IMAGING: Stable post op imaging.  ? ?LABS:  ?Results for orders placed or performed during the hospital encounter of 06/10/21 (from the past 24 hour(s))  ?Type and screen [place]     Status: None  ? Collection Time: 06/12/21  2:35 PM  ?Result Value Ref Range  ? ABO/RH(D) O POS   ? Antibody Screen NEG   ? Sample Expiration    ?  06/15/2021,2359 ?Performed at [facility], [place] [address], [place], [place] [address] ?  ?Basic metabolic panel     Status: Abnormal  ? Collection Time: 06/13/21  4:38 AM  ?Result Value Ref Range  ? Sodium 138 135 - 145 mmol/L  ? Potassium 4.0 3.5 - 5.1 mmol/L  ? Chloride 102 98 - 111 mmol/L  ? CO2 30 22 - 32 mmol/L  ? Glucose, Bld 125 (H) 70 - 99 mg/dL  ? BUN 7 (L) 8 - 23 mg/dL  ? Creatinine, Ser 0.78 0.44 - 1.00 mg/dL  ? Calcium 8.3 (L) 8.9 - 10.3 mg/dL  ? GFR, Estimated >60 >60 mL/min  ? Anion gap 6 5 - 15  ?CBC     Status: Abnormal  ? Collection Time: 06/13/21  4:38 AM  ?Result Value Ref Range  ? WBC 8.9 4.0 - 10.5 K/uL  ? RBC 3.23 (L) 3.87 - 5.11 MIL/uL  ? Hemoglobin 10.1 (L) 12.0 - 15.0 g/dL  ? HCT 31.3 (L) 36.0 - 46.0 %  ? MCV 96.9 80.0 - 100.0 fL  ? MCH 31.3 26.0 - 34.0 pg  ? MCHC 32.3 30.0  - 36.0 g/dL  ? RDW 12.9 11.5 - 15.5 %  ? Platelets 147 (L) 150 - 400 K/uL  ? nRBC 0.0 0.0 - 0.2 %  ?VITAMIN D 25 Hydroxy (Vit-D Deficiency, Fractures)     Status: Abnormal  ? Collection Time: 06/13/21  4:38 AM  ?Result Value Ref Range  ? Vit D, 25-Hydroxy 15.32 (L) 30 - 100 ng/mL  ? ? ?ASSESSMENT: Zoe Arnold is a 76 y.o. female, 1 Day Post-Op s/p ?OPEN REDUCTION INTERNAL FIXATION PELVIC FRACTURE ? ?CV/Blood loss: Acute blood loss anemia, Hgb 10.1 this morning. Hemodynamically stable ? ?PLAN: ?Weightbearing: TDWB RLE. WBAT LLE ?ROM: Unrestricted range of motion BLE ?Incisional and dressing care: OK to remove dressings 06/14/2021 and leave open to air with dry gauze PRN  ?Showering: Okay when showering getting incisions wet 06/15/2021 ?Orthopedic device(s): None  ?Pain management:  ?1. Tylenol 1000 mg q 6 hours scheduled ?2. Robaxin 500 mg q 8 hours PRN ?3. Tramadol 50-100 mg q 4 hours PRN ?4. Dilaudid 0.5 mg q 2 hours PRN ?VTE prophylaxis: Lovenox, SCDs ?ID: Vancomycin post op ?Foley/Lines: Okay to remove Foley this AM  from Ortho standpoint, KVO IVFs ?Impediments to Fracture Healing: Vitamin D level 15, started on supplementation today ?Dispo: PT/OT evaluation today, dispo pending.  Plan to remove dressings from right hip and anterior pelvis tomorrow.   ? ?D/C recommendations: ?- Tramadol for pain control ?- Eliquis 2.5 mg BID x 30 days for DVT prophylaxis ?- Continue Vit D2 supplementation 50,000 units q 7 days x 5 weeks ? ?Follow - up plan: 2 weeks after d/c for wound check and repeat x-rays ? ? ?Contact information:  Katha Hamming MD, Rushie Nyhan PA-C. After hours and holidays please check [email or web address] for group call information for [facility] ? ? ?Gwinda Passe, PA-C ?[phone] ?[email or web address] ? ? ? ?
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
## 2021-06-13 NOTE — Progress Notes (Signed)
? ?  Inpatient Rehab Admissions Coordinator : ? ?Per therapy recommendations, patient was screened for CIR candidacy by Herberta Pickron RN MSN.  At this time patient appears to be a potential candidate for CIR. I will place a rehab consult per protocol for full assessment. Please call me with any questions. ? ?Louise Victory RN MSN ?Admissions Coordinator ?[phone] ?  ?
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
## 2021-06-13 NOTE — Care Management Important Message (Signed)
Important Message ? ?Patient Details  ?Name: Zoe Arnold ?MRN: [id number] ?Date of Birth: 1945-06-13 ? ? ?Medicare Important Message Given:  Yes ? ? ? ? ?Zoe Arnold ?06/13/2021, 2:37 PM ?

## 2021-06-14 LAB — CBC
HCT: 28.5 % — ABNORMAL LOW (ref 36.0–46.0)
Hemoglobin: 9.1 g/dL — ABNORMAL LOW (ref 12.0–15.0)
MCH: 31 pg (ref 26.0–34.0)
MCHC: 31.9 g/dL (ref 30.0–36.0)
MCV: 96.9 fL (ref 80.0–100.0)
Platelets: 152 10*3/uL (ref 150–400)
RBC: 2.94 MIL/uL — ABNORMAL LOW (ref 3.87–5.11)
RDW: 13.1 % (ref 11.5–15.5)
WBC: 7.9 10*3/uL (ref 4.0–10.5)
nRBC: 0 % (ref 0.0–0.2)

## 2021-06-14 LAB — BASIC METABOLIC PANEL
Anion gap: 6 (ref 5–15)
BUN: 8 mg/dL (ref 8–23)
CO2: 31 mmol/L (ref 22–32)
Calcium: 8.2 mg/dL — ABNORMAL LOW (ref 8.9–10.3)
Chloride: 101 mmol/L (ref 98–111)
Creatinine, Ser: 0.76 mg/dL (ref 0.44–1.00)
GFR, Estimated: >60 mL/min (ref >60)
Glucose, Bld: 124 mg/dL — ABNORMAL HIGH (ref 70–99)
Potassium: 3.6 mmol/L (ref 3.5–5.1)
Sodium: 138 mmol/L (ref 135–145)

## 2021-06-14 MED ORDER — CHLORHEXIDINE GLUCONATE CLOTH 2 % EX PADS
6.0000 | MEDICATED_PAD | Freq: Every day | CUTANEOUS | Status: DC
  Administered 2021-06-14 – 2021-06-16 (×3): 6 via TOPICAL

## 2021-06-14 MED ORDER — TAMSULOSIN HCL 0.4 MG PO CAPS
0.4000 mg | ORAL_CAPSULE | Freq: Every day | ORAL | Status: DC
  Administered 2021-06-14 – 2021-06-16 (×3): 0.4 mg via ORAL
  Filled 2021-06-14 (×3): qty 1

## 2021-06-14 MED ORDER — POLYETHYLENE GLYCOL 3350 17 G PO PACK
17.0000 g | PACK | Freq: Every day | ORAL | Status: DC
  Administered 2021-06-14 – 2021-06-16 (×3): 17 g via ORAL
  Filled 2021-06-14 (×3): qty 1
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
## 2021-06-14 NOTE — Progress Notes (Signed)
?  Inpatient Rehabilitation Admissions Coordinator  ? ?I met at bedside with patient for rehab assessment. We discussed goals and expectations of a possible Cir admit She is in agreement. States she is only taking in jello and clear liquids and needs to have a BM. She will benefit from a Cir admit and I am hopeful to admit her in the next 24 to 48 hrs. She is in agreement ? ?Danne Baxter, RN, MSN ?Rehab Admissions Coordinator ?[phone] ?06/14/2021 11:03 AM ? ?
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
## 2021-06-15 LAB — CBC
HCT: 30.3 % — ABNORMAL LOW (ref 36.0–46.0)
Hemoglobin: 9.7 g/dL — ABNORMAL LOW (ref 12.0–15.0)
MCH: 30.8 pg (ref 26.0–34.0)
MCHC: 32 g/dL (ref 30.0–36.0)
MCV: 96.2 fL (ref 80.0–100.0)
Platelets: 174 10*3/uL (ref 150–400)
RBC: 3.15 MIL/uL — ABNORMAL LOW (ref 3.87–5.11)
RDW: 13.2 % (ref 11.5–15.5)
WBC: 6.1 10*3/uL (ref 4.0–10.5)
nRBC: 0 % (ref 0.0–0.2)

## 2021-06-15 LAB — BASIC METABOLIC PANEL
Anion gap: 5 (ref 5–15)
BUN: 9 mg/dL (ref 8–23)
CO2: 34 mmol/L — ABNORMAL HIGH (ref 22–32)
Calcium: 8.5 mg/dL — ABNORMAL LOW (ref 8.9–10.3)
Chloride: 100 mmol/L (ref 98–111)
Creatinine, Ser: 0.7 mg/dL (ref 0.44–1.00)
GFR, Estimated: >60 mL/min (ref >60)
Glucose, Bld: 104 mg/dL — ABNORMAL HIGH (ref 70–99)
Potassium: 3.3 mmol/L — ABNORMAL LOW (ref 3.5–5.1)
Sodium: 139 mmol/L (ref 135–145)

## 2021-06-15 MED ORDER — POTASSIUM CHLORIDE 20 MEQ PO PACK
40.0000 meq | PACK | Freq: Two times a day (BID) | ORAL | Status: AC
  Administered 2021-06-15 (×2): 40 meq via ORAL
  Filled 2021-06-15 (×2): qty 2

## 2021-06-15 MED ORDER — KETOROLAC TROMETHAMINE 15 MG/ML IJ SOLN
15.0000 mg | Freq: Four times a day (QID) | INTRAMUSCULAR | Status: DC
  Administered 2021-06-15 – 2021-06-16 (×4): 15 mg via INTRAVENOUS
  Filled 2021-06-15 (×4): qty 1

## 2021-06-15 MED ORDER — LACTASE 3000 UNITS PO TABS: 6000.0000 [IU] | ORAL_TABLET | Freq: Three times a day (TID) | ORAL | Status: DC | PRN

## 2021-06-15 MED ORDER — OXYCODONE HCL 5 MG PO TABS
5.0000 mg | ORAL_TABLET | ORAL | Status: DC | PRN
  Administered 2021-06-16 (×2): 5 mg via ORAL
  Filled 2021-06-15 (×2): qty 1

## 2021-06-15 MED ORDER — APIXABAN 2.5 MG PO TABS
2.5000 mg | ORAL_TABLET | Freq: Two times a day (BID) | ORAL | Status: DC
Start: 2021-06-15 — End: 2021-06-16
  Administered 2021-06-15 – 2021-06-16 (×3): 2.5 mg via ORAL
  Filled 2021-06-15 (×3): qty 1

## 2021-06-15 NOTE — Progress Notes (Signed)
  Inpatient Rehabilitation Admissions Coordinator   I met with patient at bedside. She complains of pain issues that limit her ability to agree to admit to CIR today. Discussed with RN and Trauma PA, Claiborne Billings. We have encouraged her to take pain meds around the clock to assist her with pain management. She also states a sensitivity to the sheets. I will follow up Friday for possible Cir admit.  Danne Baxter, RN, MSN Rehab Admissions Coordinator [phone] 06/15/2021 10:46 AM
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
## 2021-06-16 ENCOUNTER — Inpatient Hospital Stay (HOSPITAL_COMMUNITY)
Admission: RE | Admit: 2021-06-16 | Discharge: 2021-07-01 | DRG: 559 | Disposition: A | Discharge status: Home Health | Payer: Medicare Other | Source: Intra-hospital | Attending: Physical Medicine and Rehabilitation | Admitting: Physical Medicine and Rehabilitation

## 2021-06-16 ENCOUNTER — Other Ambulatory Visit: Payer: Self-pay

## 2021-06-16 ENCOUNTER — Encounter (HOSPITAL_COMMUNITY): Payer: Self-pay | Admitting: Physical Medicine and Rehabilitation

## 2021-06-16 DIAGNOSIS — T502X5A Adverse effect of carbonic-anhydrase inhibitors, benzothiadiazides and other diuretics, initial encounter: Secondary | ICD-10-CM | POA: Diagnosis not present

## 2021-06-16 DIAGNOSIS — N390 Urinary tract infection, site not specified: Secondary | ICD-10-CM | POA: Diagnosis not present

## 2021-06-16 DIAGNOSIS — L2489 Irritant contact dermatitis due to other agents: Secondary | ICD-10-CM | POA: Diagnosis present

## 2021-06-16 DIAGNOSIS — E669 Obesity, unspecified: Secondary | ICD-10-CM | POA: Diagnosis present

## 2021-06-16 DIAGNOSIS — E785 Hyperlipidemia, unspecified: Secondary | ICD-10-CM | POA: Diagnosis present

## 2021-06-16 DIAGNOSIS — F419 Anxiety disorder, unspecified: Secondary | ICD-10-CM | POA: Diagnosis present

## 2021-06-16 DIAGNOSIS — Z713 Dietary counseling and surveillance: Secondary | ICD-10-CM

## 2021-06-16 DIAGNOSIS — K661 Hemoperitoneum: Secondary | ICD-10-CM | POA: Diagnosis present

## 2021-06-16 DIAGNOSIS — R5383 Other fatigue: Secondary | ICD-10-CM | POA: Diagnosis present

## 2021-06-16 DIAGNOSIS — Z6831 Body mass index (BMI) 31.0-31.9, adult: Secondary | ICD-10-CM | POA: Diagnosis not present

## 2021-06-16 DIAGNOSIS — T43695A Adverse effect of other psychostimulants, initial encounter: Secondary | ICD-10-CM | POA: Diagnosis not present

## 2021-06-16 DIAGNOSIS — B961 Klebsiella pneumoniae [K. pneumoniae] as the cause of diseases classified elsewhere: Secondary | ICD-10-CM | POA: Diagnosis not present

## 2021-06-16 DIAGNOSIS — S32309D Unspecified fracture of unspecified ilium, subsequent encounter for fracture with routine healing: Secondary | ICD-10-CM | POA: Diagnosis not present

## 2021-06-16 DIAGNOSIS — S32301A Unspecified fracture of right ilium, initial encounter for closed fracture: Secondary | ICD-10-CM

## 2021-06-16 DIAGNOSIS — R5381 Other malaise: Secondary | ICD-10-CM | POA: Diagnosis present

## 2021-06-16 DIAGNOSIS — K5900 Constipation, unspecified: Secondary | ICD-10-CM | POA: Diagnosis not present

## 2021-06-16 DIAGNOSIS — S32591D Other specified fracture of right pubis, subsequent encounter for fracture with routine healing: Principal | ICD-10-CM

## 2021-06-16 DIAGNOSIS — R410 Disorientation, unspecified: Secondary | ICD-10-CM | POA: Diagnosis not present

## 2021-06-16 DIAGNOSIS — E739 Lactose intolerance, unspecified: Secondary | ICD-10-CM | POA: Diagnosis present

## 2021-06-16 DIAGNOSIS — Z880 Allergy status to penicillin: Secondary | ICD-10-CM

## 2021-06-16 DIAGNOSIS — I10 Essential (primary) hypertension: Secondary | ICD-10-CM | POA: Diagnosis present

## 2021-06-16 DIAGNOSIS — Z881 Allergy status to other antibiotic agents status: Secondary | ICD-10-CM

## 2021-06-16 DIAGNOSIS — Y92239 Unspecified place in hospital as the place of occurrence of the external cause: Secondary | ICD-10-CM | POA: Diagnosis not present

## 2021-06-16 DIAGNOSIS — E119 Type 2 diabetes mellitus without complications: Secondary | ICD-10-CM | POA: Diagnosis present

## 2021-06-16 DIAGNOSIS — E876 Hypokalemia: Secondary | ICD-10-CM | POA: Diagnosis present

## 2021-06-16 DIAGNOSIS — E559 Vitamin D deficiency, unspecified: Secondary | ICD-10-CM | POA: Diagnosis present

## 2021-06-16 DIAGNOSIS — S32301D Unspecified fracture of right ilium, subsequent encounter for fracture with routine healing: Secondary | ICD-10-CM | POA: Diagnosis not present

## 2021-06-16 DIAGNOSIS — I959 Hypotension, unspecified: Secondary | ICD-10-CM | POA: Diagnosis not present

## 2021-06-16 DIAGNOSIS — D62 Acute posthemorrhagic anemia: Secondary | ICD-10-CM | POA: Diagnosis present

## 2021-06-16 DIAGNOSIS — R400 Somnolence: Secondary | ICD-10-CM | POA: Diagnosis not present

## 2021-06-16 DIAGNOSIS — B9620 Unspecified Escherichia coli [E. coli] as the cause of diseases classified elsewhere: Secondary | ICD-10-CM | POA: Diagnosis not present

## 2021-06-16 DIAGNOSIS — Z8249 Family history of ischemic heart disease and other diseases of the circulatory system: Secondary | ICD-10-CM | POA: Diagnosis not present

## 2021-06-16 DIAGNOSIS — S329XXA Fracture of unspecified parts of lumbosacral spine and pelvis, initial encounter for closed fracture: Secondary | ICD-10-CM | POA: Diagnosis present

## 2021-06-16 DIAGNOSIS — Z79899 Other long term (current) drug therapy: Secondary | ICD-10-CM | POA: Diagnosis not present

## 2021-06-16 DIAGNOSIS — G4700 Insomnia, unspecified: Secondary | ICD-10-CM | POA: Diagnosis not present

## 2021-06-16 DIAGNOSIS — R339 Retention of urine, unspecified: Secondary | ICD-10-CM | POA: Diagnosis present

## 2021-06-16 DIAGNOSIS — Z87891 Personal history of nicotine dependence: Secondary | ICD-10-CM

## 2021-06-16 DIAGNOSIS — K5901 Slow transit constipation: Secondary | ICD-10-CM | POA: Diagnosis not present

## 2021-06-16 DIAGNOSIS — Z7982 Long term (current) use of aspirin: Secondary | ICD-10-CM | POA: Diagnosis not present

## 2021-06-16 DIAGNOSIS — M545 Low back pain, unspecified: Secondary | ICD-10-CM | POA: Diagnosis not present

## 2021-06-16 DIAGNOSIS — M25559 Pain in unspecified hip: Secondary | ICD-10-CM | POA: Diagnosis not present

## 2021-06-16 LAB — CBC
HCT: 29.8 % — ABNORMAL LOW (ref 36.0–46.0)
Hemoglobin: 9.6 g/dL — ABNORMAL LOW (ref 12.0–15.0)
MCH: 31.3 pg (ref 26.0–34.0)
MCHC: 32.2 g/dL (ref 30.0–36.0)
MCV: 97.1 fL (ref 80.0–100.0)
Platelets: 203 10*3/uL (ref 150–400)
RBC: 3.07 MIL/uL — ABNORMAL LOW (ref 3.87–5.11)
RDW: 13.2 % (ref 11.5–15.5)
WBC: 6.4 10*3/uL (ref 4.0–10.5)
nRBC: 0 % (ref 0.0–0.2)

## 2021-06-16 LAB — BASIC METABOLIC PANEL
Anion gap: 7 (ref 5–15)
BUN: 13 mg/dL (ref 8–23)
CO2: 32 mmol/L (ref 22–32)
Calcium: 8.8 mg/dL — ABNORMAL LOW (ref 8.9–10.3)
Chloride: 96 mmol/L — ABNORMAL LOW (ref 98–111)
Creatinine, Ser: 0.67 mg/dL (ref 0.44–1.00)
GFR, Estimated: >60 mL/min (ref >60)
Glucose, Bld: 106 mg/dL — ABNORMAL HIGH (ref 70–99)
Potassium: 4.6 mmol/L (ref 3.5–5.1)
Sodium: 135 mmol/L (ref 135–145)

## 2021-06-16 MED ORDER — SODIUM CHLORIDE 0.9% FLUSH: 3.0000 mL | INTRAVENOUS | Status: DC | PRN

## 2021-06-16 MED ORDER — HYDROCHLOROTHIAZIDE 12.5 MG PO TABS
12.5000 mg | ORAL_TABLET | Freq: Every day | ORAL | Status: DC
  Administered 2021-06-16 – 2021-06-20 (×5): 12.5 mg via ORAL
  Filled 2021-06-16 (×5): qty 1

## 2021-06-16 MED ORDER — ATORVASTATIN CALCIUM 10 MG PO TABS
20.0000 mg | ORAL_TABLET | Freq: Every day | ORAL | Status: DC
  Administered 2021-06-16 – 2021-06-30 (×15): 20 mg via ORAL
  Filled 2021-06-16 (×15): qty 2

## 2021-06-16 MED ORDER — DOCUSATE SODIUM 100 MG PO CAPS
100.0000 mg | ORAL_CAPSULE | Freq: Two times a day (BID) | ORAL | Status: DC
  Administered 2021-06-16 – 2021-06-26 (×20): 100 mg via ORAL
  Filled 2021-06-16 (×22): qty 1

## 2021-06-16 MED ORDER — VITAMIN D (ERGOCALCIFEROL) 1.25 MG (50000 UNIT) PO CAPS
50000.0000 [IU] | ORAL_CAPSULE | ORAL | Status: DC
  Administered 2021-06-20 – 2021-06-27 (×2): 50000 [IU] via ORAL
  Filled 2021-06-16 (×2): qty 1

## 2021-06-16 MED ORDER — NAPHAZOLINE-GLYCERIN 0.012-0.25 % OP SOLN: 1.0000 [drp] | Freq: Four times a day (QID) | OPHTHALMIC | Status: DC | PRN

## 2021-06-16 MED ORDER — ALUM & MAG HYDROXIDE-SIMETH 200-200-20 MG/5ML PO SUSP: 30.0000 mL | ORAL | Status: DC | PRN

## 2021-06-16 MED ORDER — TRAMADOL HCL 50 MG PO TABS
50.0000 mg | ORAL_TABLET | Freq: Four times a day (QID) | ORAL | Status: DC | PRN
  Administered 2021-06-16 – 2021-06-30 (×8): 100 mg via ORAL
  Filled 2021-06-16 (×8): qty 2

## 2021-06-16 MED ORDER — PROCHLORPERAZINE 25 MG RE SUPP: 12.5000 mg | Freq: Four times a day (QID) | RECTAL | Status: DC | PRN

## 2021-06-16 MED ORDER — SORBITOL 70 % SOLN
30.0000 mL | Freq: Every day | Status: DC | PRN
  Administered 2021-06-17: 30 mL via ORAL
  Filled 2021-06-16: qty 30

## 2021-06-16 MED ORDER — WHITE PETROLATUM EX OINT: TOPICAL_OINTMENT | CUTANEOUS | Status: DC | PRN

## 2021-06-16 MED ORDER — LACTASE 3000 UNITS PO TABS: 6000.0000 [IU] | ORAL_TABLET | Freq: Three times a day (TID) | ORAL | Status: DC | PRN

## 2021-06-16 MED ORDER — GUAIFENESIN-DM 100-10 MG/5ML PO SYRP: 5.0000 mL | ORAL_SOLUTION | Freq: Four times a day (QID) | ORAL | Status: DC | PRN

## 2021-06-16 MED ORDER — LOSARTAN POTASSIUM 50 MG PO TABS
100.0000 mg | ORAL_TABLET | Freq: Every day | ORAL | Status: DC
  Administered 2021-06-16 – 2021-06-25 (×10): 100 mg via ORAL
  Filled 2021-06-16 (×10): qty 2

## 2021-06-16 MED ORDER — TAMSULOSIN HCL 0.4 MG PO CAPS
0.4000 mg | ORAL_CAPSULE | Freq: Every day | ORAL | Status: DC
Start: 2021-06-17 — End: 2021-06-30
  Administered 2021-06-17 – 2021-06-30 (×14): 0.4 mg via ORAL
  Filled 2021-06-16 (×14): qty 1

## 2021-06-16 MED ORDER — METOPROLOL SUCCINATE ER 25 MG PO TB24
25.0000 mg | ORAL_TABLET | Freq: Every day | ORAL | Status: DC
Start: 2021-06-16 — End: 2021-07-01
  Administered 2021-06-16 – 2021-06-30 (×15): 25 mg via ORAL
  Filled 2021-06-16 (×15): qty 1

## 2021-06-16 MED ORDER — METHOCARBAMOL 500 MG PO TABS
500.0000 mg | ORAL_TABLET | Freq: Four times a day (QID) | ORAL | Status: DC | PRN
  Administered 2021-06-17 – 2021-07-01 (×7): 500 mg via ORAL
  Filled 2021-06-16 (×7): qty 1

## 2021-06-16 MED ORDER — BETHANECHOL CHLORIDE 25 MG PO TABS
25.0000 mg | ORAL_TABLET | Freq: Three times a day (TID) | ORAL | Status: DC
  Administered 2021-06-16 – 2021-06-20 (×12): 25 mg via ORAL
  Filled 2021-06-16 (×12): qty 1

## 2021-06-16 MED ORDER — TRAZODONE HCL 50 MG PO TABS
25.0000 mg | ORAL_TABLET | Freq: Every evening | ORAL | Status: DC | PRN
  Administered 2021-06-16 – 2021-06-30 (×10): 50 mg via ORAL
  Filled 2021-06-16 (×10): qty 1

## 2021-06-16 MED ORDER — PROCHLORPERAZINE MALEATE 5 MG PO TABS: 5.0000 mg | ORAL_TABLET | Freq: Four times a day (QID) | ORAL | Status: DC | PRN

## 2021-06-16 MED ORDER — DIAZEPAM 5 MG PO TABS
5.0000 mg | ORAL_TABLET | Freq: Two times a day (BID) | ORAL | Status: DC
Start: 2021-06-16 — End: 2021-06-23
  Administered 2021-06-16 – 2021-06-23 (×14): 5 mg via ORAL
  Filled 2021-06-16 (×14): qty 1

## 2021-06-16 MED ORDER — APIXABAN 2.5 MG PO TABS
2.5000 mg | ORAL_TABLET | Freq: Two times a day (BID) | ORAL | Status: DC
  Administered 2021-06-16 – 2021-07-01 (×30): 2.5 mg via ORAL
  Filled 2021-06-16 (×30): qty 1

## 2021-06-16 MED ORDER — HYDROMORPHONE HCL 1 MG/ML IJ SOLN
0.5000 mg | Freq: Four times a day (QID) | INTRAMUSCULAR | Status: DC | PRN
  Administered 2021-06-17: 0.5 mg via INTRAVENOUS
  Filled 2021-06-16 (×2): qty 0.5

## 2021-06-16 MED ORDER — POLYETHYLENE GLYCOL 3350 17 G PO PACK
17.0000 g | PACK | Freq: Every day | ORAL | Status: DC | PRN
Start: 2021-06-16 — End: 2021-07-01

## 2021-06-16 MED ORDER — PROCHLORPERAZINE EDISYLATE 10 MG/2ML IJ SOLN: 5.0000 mg | Freq: Four times a day (QID) | INTRAMUSCULAR | Status: DC | PRN

## 2021-06-16 MED ORDER — FLEET ENEMA 7-19 GM/118ML RE ENEM
1.0000 | ENEMA | Freq: Once | RECTAL | Status: DC | PRN
Start: 2021-06-16 — End: 2021-07-01

## 2021-06-16 MED ORDER — DIPHENHYDRAMINE HCL 12.5 MG/5ML PO ELIX
12.5000 mg | ORAL_SOLUTION | Freq: Four times a day (QID) | ORAL | Status: DC | PRN
  Filled 2021-06-16: qty 10

## 2021-06-16 MED ORDER — POLYETHYLENE GLYCOL 3350 17 G PO PACK: 17.0000 g | PACK | Freq: Two times a day (BID) | ORAL | Status: DC

## 2021-06-16 MED ORDER — OXYCODONE HCL 5 MG PO TABS
5.0000 mg | ORAL_TABLET | ORAL | Status: DC | PRN
  Administered 2021-06-16 – 2021-06-25 (×12): 5 mg via ORAL
  Filled 2021-06-16 (×13): qty 1

## 2021-06-16 MED ORDER — HYDROMORPHONE HCL 1 MG/ML IJ SOLN
0.5000 mg | Freq: Four times a day (QID) | INTRAMUSCULAR | Status: DC | PRN
  Administered 2021-06-16: 0.5 mg via INTRAVENOUS
  Filled 2021-06-16: qty 0.5

## 2021-06-16 NOTE — Progress Notes (Signed)
Patient arrived and oriented to unit activities. No questions or concerns at this time. Resting comfortably with call bell in place

## 2021-06-16 NOTE — Progress Notes (Signed)
Patient able to use female urinal to void. BSCan showed 220 volume, patient then voided 275.

## 2021-06-16 NOTE — Progress Notes (Signed)
Inpatient Rehabilitation Admission Medication Review by a Pharmacist  A complete drug regimen review was completed for this patient to identify any potential clinically significant medication issues.  High Risk Drug Classes Is patient taking? Indication by Medication  Antipsychotic Yes Compazine- N/V  Anticoagulant Yes Apixaban- VTE prophylaxis 2/2 ortho surgery  Antibiotic No   Opioid Yes Tramadol, dilaudid- acute pain  Antiplatelet No   Hypoglycemics/insulin No   Vasoactive Medication Yes Losartan, HCTZ, toprol, flomax- hypertension, rate control, BPH  Chemotherapy No   Other Yes Lipitor- HLD Robaxin- muscle spasms     Type of Medication Issue Identified Description of Issue Recommendation(s)  Drug Interaction(s) (clinically significant)     Duplicate Therapy     Allergy     No Medication Administration End Date     Incorrect Dose     Additional Drug Therapy Needed     Significant med changes from prior encounter (inform family/care partners about these prior to discharge).    Other  PTA meds: Aspirin Vitamin B complex Restart PTA meds when and if necessary during CIR admission or at time of discharge, if warranted     Clinically significant medication issues were identified that warrant physician communication and completion of prescribed/recommended actions by midnight of the next day:  No  Time spent performing this drug regimen review (minutes):  30   Earle Reome BS, PharmD, BCPS Clinical Pharmacist 06/16/2021 1:29 PM  Contact: [phone] after 3 PM  "Be curious, not judgmental..." -Walt Whitman 

## 2021-06-16 NOTE — Progress Notes (Signed)
Inpatient Rehabilitation Admissions Coordinator   Per patient request I contacted pt's spouse and left voicemail of her room number to be 4 [address] 02. I also tried to call her son, but his voicemail was full.  Ottie Glazier, RN, MSN Rehab Admissions Coordinator [phone] 06/16/2021 2:40 PM
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
## 2021-06-16 NOTE — Progress Notes (Signed)
Inpatient Rehabilitation Admissions Coordinator   I met at bedside with patient and she is in agreement to admit to CIR today. Acute team and TOC made aware. I will make the arrangements to admit today.  Danne Baxter, RN, MSN Rehab Admissions Coordinator [phone] 06/16/2021 11:46 AM
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
## 2021-06-16 NOTE — Progress Notes (Signed)
[name] foley remove per order. Perineal care completed. 800 ml urine emptied from foley bag, UA with sediments and pink tinged color. Pt due to void. Dionne Bucy RN

## 2021-06-16 NOTE — Progress Notes (Addendum)
Report given to [facility] nurse Hillary. Pt discharge education completed. [name] room not ready per CIR nurse and RN will be notified by CIR nurse once the room is available. Pt remains due to void and continue to have decreased appetite. Pt resting in bed and will continue to closely monitor. Dionne Bucy RN
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
## 2021-06-16 NOTE — Progress Notes (Signed)
Pt discharged to CIR and transported off unit to CIR Room 4W02 via bed with her belongings including her personal cell phone and glasses. Dionne Bucy RN
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
## 2021-06-16 NOTE — TOC Transition Note (Signed)
Transition of Care [facility]) - CM/SW Discharge Note   Patient Details  Name: Zoe Arnold MRN: [id number] Date of Birth: May 05, 1945  [facility] CM/SW Contact:  Glennon Mac, RN Phone Number: 06/16/2021, 12:06 PM   Clinical Narrative:    Pt medically stable for discharge today, and has been accepted for admission to [facility] IP Rehab.  Plan transfer to CIR when bed ready.     Final next level of care: IP Rehab Facility Barriers to Discharge: Barriers Resolved   Patient Goals and CMS Choice Patient states their goals for this hospitalization and ongoing recovery are:: to go home CMS Medicare.gov Compare Post Acute Care list provided to:: Patient Choice offered to / list presented to : Patient                        Discharge Plan and Services   Discharge Planning Services: CM Consult Post Acute Care Choice: IP Rehab                               Social Determinants of Health (SDOH) Interventions     Readmission Risk Interventions     View : No data to display.         Quintella Baton, RN, BSN  Trauma/Neuro ICU Case Manager [phone]

## 2021-06-16 NOTE — Progress Notes (Incomplete)
Inpatient Rehabilitation Admission Medication Review by a Pharmacist  A complete drug regimen review was completed for this patient to identify any potential clinically significant medication issues.  High Risk Drug Classes Is patient taking? Indication by Medication  Antipsychotic Yes Compazine- N/V  Anticoagulant Yes Apixaban- VTE prophylaxis 2/2 ortho surgery  Antibiotic No   Opioid Yes Tramadol, dilaudid- acute pain  Antiplatelet No   Hypoglycemics/insulin No   Vasoactive Medication Yes Losartan, HCTZ, toprol, flomax- hypertension, rate control, BPH  Chemotherapy No   Other Yes Lipitor- HLD Robaxin- muscle spasms     Type of Medication Issue Identified Description of Issue Recommendation(s)  Drug Interaction(s) (clinically significant)     Duplicate Therapy     Allergy     No Medication Administration End Date     Incorrect Dose     Additional Drug Therapy Needed     Significant med changes from prior encounter (inform family/care partners about these prior to discharge).    Other  PTA meds: Aspirin Vitamin B complex Restart PTA meds when and if necessary during CIR admission or at time of discharge, if warranted     Clinically significant medication issues were identified that warrant physician communication and completion of prescribed/recommended actions by midnight of the next day:  No  Time spent performing this drug regimen review (minutes):  30   Manning Luna BS, PharmD, BCPS Clinical Pharmacist 06/16/2021 1:29 PM  Contact: [phone] after 3 PM  "Be curious, not judgmental..." -Debbora Dus

## 2021-06-17 DIAGNOSIS — R5383 Other fatigue: Secondary | ICD-10-CM | POA: Diagnosis not present

## 2021-06-17 DIAGNOSIS — S32301A Unspecified fracture of right ilium, initial encounter for closed fracture: Secondary | ICD-10-CM | POA: Diagnosis not present

## 2021-06-17 DIAGNOSIS — I10 Essential (primary) hypertension: Secondary | ICD-10-CM | POA: Diagnosis not present

## 2021-06-17 LAB — CBC WITH DIFFERENTIAL/PLATELET
Abs Immature Granulocytes: 0.07 10*3/uL (ref 0.00–0.07)
Basophils Absolute: 0 10*3/uL (ref 0.0–0.1)
Basophils Relative: 0 %
Eosinophils Absolute: 0.1 10*3/uL (ref 0.0–0.5)
Eosinophils Relative: 1 %
HCT: 32.1 % — ABNORMAL LOW (ref 36.0–46.0)
Hemoglobin: 10.8 g/dL — ABNORMAL LOW (ref 12.0–15.0)
Immature Granulocytes: 1 %
Lymphocytes Relative: 14 %
Lymphs Abs: 1.3 10*3/uL (ref 0.7–4.0)
MCH: 31.4 pg (ref 26.0–34.0)
MCHC: 33.6 g/dL (ref 30.0–36.0)
MCV: 93.3 fL (ref 80.0–100.0)
Monocytes Absolute: 0.9 10*3/uL (ref 0.1–1.0)
Monocytes Relative: 10 %
Neutro Abs: 6.8 10*3/uL (ref 1.7–7.7)
Neutrophils Relative %: 74 %
Platelets: 243 10*3/uL (ref 150–400)
RBC: 3.44 MIL/uL — ABNORMAL LOW (ref 3.87–5.11)
RDW: 13.2 % (ref 11.5–15.5)
WBC: 9.2 10*3/uL (ref 4.0–10.5)
nRBC: 0 % (ref 0.0–0.2)

## 2021-06-17 LAB — COMPREHENSIVE METABOLIC PANEL
ALT: 19 U/L (ref 0–44)
AST: 28 U/L (ref 15–41)
Albumin: 3.1 g/dL — ABNORMAL LOW (ref 3.5–5.0)
Alkaline Phosphatase: 65 U/L (ref 38–126)
Anion gap: 10 (ref 5–15)
BUN: 10 mg/dL (ref 8–23)
CO2: 28 mmol/L (ref 22–32)
Calcium: 9.3 mg/dL (ref 8.9–10.3)
Chloride: 98 mmol/L (ref 98–111)
Creatinine, Ser: 0.71 mg/dL (ref 0.44–1.00)
GFR, Estimated: >60 mL/min (ref >60)
Glucose, Bld: 107 mg/dL — ABNORMAL HIGH (ref 70–99)
Potassium: 4.1 mmol/L (ref 3.5–5.1)
Sodium: 136 mmol/L (ref 135–145)
Total Bilirubin: 2.3 mg/dL — ABNORMAL HIGH (ref 0.3–1.2)
Total Protein: 6.4 g/dL — ABNORMAL LOW (ref 6.5–8.1)

## 2021-06-17 LAB — MAGNESIUM: Magnesium: 1.7 mg/dL (ref 1.7–2.4)

## 2021-06-17 LAB — VITAMIN B12: Vitamin B-12: 1875 pg/mL — ABNORMAL HIGH (ref 180–914)

## 2021-06-17 MED ORDER — ACETAMINOPHEN 325 MG PO TABS
650.0000 mg | ORAL_TABLET | Freq: Three times a day (TID) | ORAL | Status: DC
  Administered 2021-06-17 – 2021-07-01 (×43): 650 mg via ORAL
  Filled 2021-06-17 (×43): qty 2

## 2021-06-17 MED ORDER — CYANOCOBALAMIN 1000 MCG/ML IJ SOLN
1000.0000 ug | Freq: Once | INTRAMUSCULAR | Status: AC
  Administered 2021-06-17: 1000 ug via INTRAMUSCULAR
  Filled 2021-06-17: qty 1

## 2021-06-17 MED ORDER — MAGNESIUM GLUCONATE 500 MG PO TABS
250.0000 mg | ORAL_TABLET | Freq: Every day | ORAL | Status: DC
  Administered 2021-06-17: 250 mg via ORAL
  Filled 2021-06-17: qty 1
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
## 2021-06-17 NOTE — Progress Notes (Signed)
Physical Therapy Session Note  Patient Details  Name: Zoe Arnold MRN: [id number] Date of Birth: 1945-06-29  Today's Date: 06/17/2021 PT Individual Time: 1302-1343 PT Individual Time Calculation (min): 41 min   Today's Date: 06/17/2021 PT Missed Time: 19 Minutes Missed Time Reason: Pain;Patient fatigue  Short Term Goals: Week 1:  PT Short Term Goal 1 (Week 1): pt will roll L/R with mod A of 1 PT Short Term Goal 2 (Week 1): pt will transfer sit<>stand with LRAD and max A of 1 PT Short Term Goal 3 (Week 1): pt will transfer bed<>chair with LRAD and max A of 1  Skilled Therapeutic Interventions/Progress Updates:   Received pt supine in bed reporting urge to void. Retrieved bedpan and female urinal due to urgency and safety (no +2 available initially). Pt reported pain 10/10 in R hip (premedicated) - repositioning, rest breaks, and activity modification done to reduce pain levels. Rolled to L with total A to place bedpan, however pt unable to roll completley on L side to place bedpan and began urinating before therapist could place urinal. Once urinal was placed, pt unable to continue voiding. Rolled L with total A to removed soiled chuck pad and required total A to place new one. Pt began screaming and crying in pain and unable to tolerate complete rolls. Of note, pt fidgeting and pulling herself to [facility], so far that her head was hanging off despite therapist asking multiple times not to go any higher in bed. NT arrived and assisted with linen change. Pt rolled L/R with total A +2 to change bed linens (continued to yell in pain), then reported feeling dizzy when bed was flat and rolling. BP:159/94. Raised bed into chair position in preparation to eat lunch; pt initially refused to eat but with maximal encouragement, pt took a few bites of fruit cup. Pt unable to participate in any further mobility due to pain/fatigue. Placed pillow under R hip and ice pack for pain relief. Concluded session with pt  semi-reclined in bed, needs within reach, and bed alarm on. 19 minutes missed of skilled physical therapy due to pain/fatigue.   Therapy Documentation Precautions:  Restrictions Weight Bearing Restrictions: Yes RLE Weight Bearing: Touchdown weight bearing LLE Weight Bearing: Weight bearing as tolerated  Therapy/Group: Individual Therapy  Martin Majestic PT, DPT  06/17/2021, 7:07 AM
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
## 2021-06-17 NOTE — Plan of Care (Signed)
Problem: RH Balance Goal: LTG: Patient will maintain dynamic sitting balance (OT) Description: LTG:  Patient will maintain dynamic sitting balance with assistance during activities of daily living (OT) Flowsheets (Taken 06/17/2021 1230) LTG: Pt will maintain dynamic sitting balance during ADLs with: Supervision/Verbal cueing Goal: LTG Patient will maintain dynamic standing with ADLs (OT) Description: LTG:  Patient will maintain dynamic standing balance with assist during activities of daily living (OT)  Flowsheets (Taken 06/17/2021 1230) LTG: Pt will maintain dynamic standing balance during ADLs with: Contact Guard/Touching assist   Problem: Sit to Stand Goal: LTG:  Patient will perform sit to stand in prep for activites of daily living with assistance level (OT) Description: LTG:  Patient will perform sit to stand in prep for activites of daily living with assistance level (OT) Flowsheets (Taken 06/17/2021 1230) LTG: PT will perform sit to stand in prep for activites of daily living with assistance level: Contact Guard/Touching assist   Problem: RH Eating Goal: LTG Patient will perform eating w/assist, cues/equip (OT) Description: LTG: Patient will perform eating with assist, with/without cues using equipment (OT) Flowsheets (Taken 06/17/2021 1230) LTG: Pt will perform eating with assistance level of: Set up assist    Problem: RH Grooming Goal: LTG Patient will perform grooming w/assist,cues/equip (OT) Description: LTG: Patient will perform grooming with assist, with/without cues using equipment (OT) Flowsheets (Taken 06/17/2021 1230) LTG: Pt will perform grooming with assistance level of: Set up assist    Problem: RH Bathing Goal: LTG Patient will bathe all body parts with assist levels (OT) Description: LTG: Patient will bathe all body parts with assist levels (OT) Flowsheets (Taken 06/17/2021 1230) LTG: Pt will perform bathing with assistance level/cueing: Minimal Assistance - Patient  > 75%   Problem: RH Dressing Goal: LTG Patient will perform upper body dressing (OT) Description: LTG Patient will perform upper body dressing with assist, with/without cues (OT). Flowsheets (Taken 06/17/2021 1230) LTG: Pt will perform upper body dressing with assistance level of: Set up assist Goal: LTG Patient will perform lower body dressing w/assist (OT) Description: LTG: Patient will perform lower body dressing with assist, with/without cues in positioning using equipment (OT) Flowsheets (Taken 06/17/2021 1230) LTG: Pt will perform lower body dressing with assistance level of: Minimal Assistance - Patient > 75%   Problem: RH Toileting Goal: LTG Patient will perform toileting task (3/3 steps) with assistance level (OT) Description: LTG: Patient will perform toileting task (3/3 steps) with assistance level (OT)  Flowsheets (Taken 06/17/2021 1230) LTG: Pt will perform toileting task (3/3 steps) with assistance level: Minimal Assistance - Patient > 75%   Problem: RH Functional Use of Upper Extremity Goal: LTG Patient will use RT/LT upper extremity as a (OT) Description: LTG: Patient will use right/left upper extremity as a stabilizer/gross assist/diminished/nondominant/dominant level with assist, with/without cues during functional activity (OT) Flowsheets (Taken 06/17/2021 1230) LTG: Use of upper extremity in functional activities: RUE as dominant level LTG: Pt will use upper extremity in functional activity with assistance level of: Independent   Problem: RH Toilet Transfers Goal: LTG Patient will perform toilet transfers w/assist (OT) Description: LTG: Patient will perform toilet transfers with assist, with/without cues using equipment (OT) Flowsheets (Taken 06/17/2021 1230) LTG: Pt will perform toilet transfers with assistance level of: Contact Guard/Touching assist   Problem: RH Tub/Shower Transfers Goal: LTG Patient will perform tub/shower transfers w/assist (OT) Description: LTG:  Patient will perform tub/shower transfers with assist, with/without cues using equipment (OT) Flowsheets (Taken 06/17/2021 1230) LTG: Pt will perform tub/shower stall transfers with  assistance level of: Minimal Assistance - Patient > 75%

## 2021-06-17 NOTE — Progress Notes (Signed)
PROGRESS NOTE   Subjective/Complaints: No new complaints this morning Pain and fatigue limited therapy She takes B12 at home- discussed that IM is better absorbable and ordered here  ROS: +fatigue   Objective:   No results found. Recent Labs    06/16/21 0044 06/17/21 0520  WBC 6.4 9.2  HGB 9.6* 10.8*  HCT 29.8* 32.1*  PLT 203 243   Recent Labs    06/16/21 0044 06/17/21 0520  NA 135 136  K 4.6 4.1  CL 96* 98  CO2 32 28  GLUCOSE 106* 107*  BUN 13 10  CREATININE 0.67 0.71  CALCIUM 8.8* 9.3    Intake/Output Summary (Last 24 hours) at 06/17/2021 1550 Last data filed at 06/17/2021 1522 Gross per 24 hour  Intake 315 ml  Output 1925 ml  Net -1610 ml        Physical Exam: Vital Signs Blood pressure (!) 159/75, pulse 86, temperature 98.5 F (36.9 C), temperature source Oral, resp. rate 16, height 5\' 2"  (1.575 m), weight 78.8 kg, SpO2 92 %.  Constitutional:      General: She is not in acute distress.    Appearance: She is obese. BMI 31.77 HENT:     Head: Normocephalic and atraumatic.     Mouth/Throat:     Mouth: Mucous membranes are dry.  Cardio: Reg rate Chest: normal effort, normal rate of breathing Abdominal:     General: Bowel sounds are normal.     Palpations: Abdomen is soft.  Musculoskeletal:        General: No swelling. RLE 3/5, LLE 4/5.  Skin:    General: Skin is warm and dry.  Neurological:     General: No focal deficit present.     Mental Status: She is alert and oriented to person, place, and time.  Psychiatric:        Mood and Affect: Mood normal.        Behavior: Behavior normal.     Assessment/Plan: 1. Functional deficits which require 3+ hours per day of interdisciplinary therapy in a comprehensive inpatient rehab setting. Physiatrist is providing close team supervision and 24 hour management of active medical problems listed below. Physiatrist and rehab team continue to assess  barriers to discharge/monitor patient progress toward functional and medical goals  Care Tool:  Bathing  Bathing activity did not occur: Safety/medical concerns           Bathing assist       Upper Body Dressing/Undressing Upper body dressing Upper body dressing/undressing activity did not occur (including orthotics): Safety/medical concerns      Upper body assist      Lower Body Dressing/Undressing Lower body dressing      What is the patient wearing?: Underwear/pull up     Lower body assist Assist for lower body dressing: Total Assistance - Patient < 25%     Toileting Toileting Toileting Activity did not occur and hygiene only): N/A (no void or bm)  Toileting assist       Transfers Chair/bed transfer  Transfers assist  Chair/bed transfer activity did not occur: Safety/medical concerns (pain, weakness/deconditioning, lethargic)        Locomotion Ambulation  Ambulation assist   Ambulation activity did not occur: Safety/medical concerns (pain, weakness/deconditioning, lethargic)          Walk 10 feet activity   Assist  Walk 10 feet activity did not occur: Safety/medical concerns (pain, weakness/deconditioning, lethargic)        Walk 50 feet activity   Assist Walk 50 feet with 2 turns activity did not occur: Safety/medical concerns (pain, weakness/deconditioning, lethargic)         Walk 150 feet activity   Assist Walk 150 feet activity did not occur: Safety/medical concerns (pain, weakness/deconditioning, lethargic)         Walk 10 feet on uneven surface  activity   Assist Walk 10 feet on uneven surfaces activity did not occur: Safety/medical concerns (pain, weakness/deconditioning, lethargic)         Wheelchair     Assist Is the patient using a wheelchair?: Yes Type of Wheelchair: Manual Wheelchair activity did not occur: Safety/medical concerns (pain, weakness/deconditioning, lethargic)          Wheelchair 50 feet with 2 turns activity    Assist    Wheelchair 50 feet with 2 turns activity did not occur: Safety/medical concerns (pain, weakness/deconditioning, lethargic)       Wheelchair 150 feet activity     Assist  Wheelchair 150 feet activity did not occur: Safety/medical concerns (pain, weakness/deconditioning, lethargic)       Blood pressure (!) 159/75, pulse 86, temperature 98.5 F (36.9 C), temperature source Oral, resp. rate 16, height 5\' 2"  (1.575 m), weight 78.8 kg, SpO2 92 %.   Medical Problem List and Plan: 1. Functional deficits secondary to pelvic fracture             -patient may shower             -ELOS/Goals: 20-22 days S/MinA             -Initial CIR evaluations today 2.  Antithrombotics: -DVT/anticoagulation:  Pharmaceutical: Other (comment)apixaban 2.5 mg for 30 days (stop 6/17)             -antiplatelet therapy: ? restart home aspirin 81 mg 3. Pain Management: Currently receiving scheduled Tylenol and Valium BID. Using Ultram 100 mg approximately once a day and oxycodone 5 mg infrequently (q 4 hours prn). Also has Dilaudid 0.5 mg q 2 hours prn pain not relieved with oral meds (had only one dose yesterday). Robaxin started yesterday 4. Mood: LCSW to evaluate and provide emotional support             -antipsychotic agents: n/a 5. Neuropsych: This patient is capable of making decisions on her own behalf. 6. Skin/Wound Care: Routine skin care checks 7. Fluids/Electrolytes/Nutrition: routine Is and Os and follow-up chemistries             --avoid dairy due to lactose intolerance 8: Pelvic fracture s/p percutaneous fixation             --WBAT on left lower extremity             --TDWB on right lower extremity             --continue Vit D supplement weekly             --plan follow-up x-rays on 2 weeks per ortho 9: ABLA/small RP hematoma: stable; monitor H and H; follow-up CBC 10: Urinary retention: Foley removed 5/19; monitor for spontaneous  void --Flomax started 5/17 0.4 mg daily --Urecholine started 5/16 25 mg TID 11: Hyperlipidemia: continue Lipitor  12: Hypokalemia: K+ 40 mEq BID; follow-up BMP 13: Hypertension:  --continue home Toprol XL 25 mg q HS             --continue HCTZ 12.5 mg q HS             --continue losartan 100 mg daily             -magnesium gluconate 250mg  HS ordered 14: Lactose intolerant: avoid dairy; Lactaid tabs TID 15. Vitamin D deficiency: Level is 15.32: continue ergocalciferol 50,000U once per week for 7 weeks 16. Fatigue: IM B12 ordered. 17. Obesity: BMI 31.77: provide dietary education  LOS: 1 days A FACE TO FACE EVALUATION WAS PERFORMED  Clint BolderKrutika P Beauden Tremont 06/17/2021, 3:50 PM

## 2021-06-18 DIAGNOSIS — S32301A Unspecified fracture of right ilium, initial encounter for closed fracture: Secondary | ICD-10-CM | POA: Diagnosis not present

## 2021-06-18 DIAGNOSIS — R5383 Other fatigue: Secondary | ICD-10-CM | POA: Diagnosis not present

## 2021-06-18 DIAGNOSIS — I10 Essential (primary) hypertension: Secondary | ICD-10-CM | POA: Diagnosis not present

## 2021-06-18 LAB — GLUCOSE, CAPILLARY
Glucose-Capillary: 116 mg/dL — ABNORMAL HIGH (ref 70–99)
Glucose-Capillary: 107 mg/dL — ABNORMAL HIGH (ref 70–99)

## 2021-06-18 MED ORDER — MAGNESIUM GLUCONATE 500 MG PO TABS
500.0000 mg | ORAL_TABLET | Freq: Every day | ORAL | Status: DC
  Administered 2021-06-18 – 2021-06-30 (×13): 500 mg via ORAL
  Filled 2021-06-18 (×13): qty 1

## 2021-06-18 MED ORDER — ASCORBIC ACID 500 MG PO TABS
1000.0000 mg | ORAL_TABLET | Freq: Every day | ORAL | Status: DC
  Administered 2021-06-18 – 2021-07-01 (×14): 1000 mg via ORAL
  Filled 2021-06-18 (×14): qty 2

## 2021-06-18 MED ORDER — MODAFINIL 100 MG PO TABS
100.0000 mg | ORAL_TABLET | Freq: Every day | ORAL | Status: DC
  Administered 2021-06-19 – 2021-06-20 (×2): 100 mg via ORAL
  Filled 2021-06-18 (×2): qty 1

## 2021-06-18 NOTE — Progress Notes (Signed)
Occupational Therapy Note  Patient Details  Name: Zoe Arnold MRN: [id number] Date of Birth: [birth year]  Today's Date: 06/18/2021   Attempted to see pt to make up for missed minutes however pt asleep unable to arouse, will continue efforts to make up for missed minutes.    Pollyann Glen [facility] 06/18/2021, 11:27 AM

## 2021-06-18 NOTE — Progress Notes (Signed)
PROGRESS NOTE   Subjective/Complaints: Sleepy this morning.  Missed therapy due to somnolence- will trial modafinil in the morning to help stimulate her.   ROS: +fatigue, +pain   Objective:   No results found. Recent Labs    06/16/21 0044 06/17/21 0520  WBC 6.4 9.2  HGB 9.6* 10.8*  HCT 29.8* 32.1*  PLT 203 243   Recent Labs    06/16/21 0044 06/17/21 0520  NA 135 136  K 4.6 4.1  CL 96* 98  CO2 32 28  GLUCOSE 106* 107*  BUN 13 10  CREATININE 0.67 0.71  CALCIUM 8.8* 9.3    Intake/Output Summary (Last 24 hours) at 06/18/2021 1501 Last data filed at 06/18/2021 1254 Gross per 24 hour  Intake 375 ml  Output 375 ml  Net 0 ml        Physical Exam: Vital Signs Blood pressure (!) 129/107, pulse 88, temperature 98 F (36.7 C), resp. rate 16, height 5\' 2"  (1.575 m), weight 78.8 kg, SpO2 96 %.  Constitutional:      General: She is not in acute distress.    Appearance: She is obese. BMI 31.77. Somnolent  HENT:     Head: Normocephalic and atraumatic.     Mouth/Throat:     Mouth: Mucous membranes are dry.  Cardio: Reg rate Chest: normal effort, normal rate of breathing Abdominal:     General: Bowel sounds are normal.     Palpations: Abdomen is soft.  Musculoskeletal:        General: No swelling. RLE 3/5, LLE 4/5.  Skin:    General: Skin is warm and dry.  Neurological:     General: No focal deficit present.     Mental Status: She is alert and oriented to person, place, and time.  Psychiatric:        Mood and Affect: Mood normal.        Behavior: Behavior normal.     Assessment/Plan: 1. Functional deficits which require 3+ hours per day of interdisciplinary therapy in a comprehensive inpatient rehab setting. Physiatrist is providing close team supervision and 24 hour management of active medical problems listed below. Physiatrist and rehab team continue to assess barriers to discharge/monitor patient  progress toward functional and medical goals  Care Tool:  Bathing  Bathing activity did not occur: Safety/medical concerns     Body parts bathed by helper: Buttocks     Bathing assist Assist Level: Dependent - Patient 0%     Upper Body Dressing/Undressing Upper body dressing Upper body dressing/undressing activity did not occur (including orthotics): Safety/medical concerns      Upper body assist      Lower Body Dressing/Undressing Lower body dressing      What is the patient wearing?: Underwear/pull up     Lower body assist Assist for lower body dressing: Total Assistance - Patient < 25%     Toileting Toileting Toileting Activity did not occur Landscape architect and hygiene only): N/A (no void or bm)  Toileting assist       Transfers Chair/bed transfer  Transfers assist  Chair/bed transfer activity did not occur: Safety/medical concerns (pain, weakness/deconditioning, lethargic)  Locomotion Ambulation   Ambulation assist   Ambulation activity did not occur: Safety/medical concerns (pain, weakness/deconditioning, lethargic)          Walk 10 feet activity   Assist  Walk 10 feet activity did not occur: Safety/medical concerns (pain, weakness/deconditioning, lethargic)        Walk 50 feet activity   Assist Walk 50 feet with 2 turns activity did not occur: Safety/medical concerns (pain, weakness/deconditioning, lethargic)         Walk 150 feet activity   Assist Walk 150 feet activity did not occur: Safety/medical concerns (pain, weakness/deconditioning, lethargic)         Walk 10 feet on uneven surface  activity   Assist Walk 10 feet on uneven surfaces activity did not occur: Safety/medical concerns (pain, weakness/deconditioning, lethargic)         Wheelchair     Assist Is the patient using a wheelchair?: Yes Type of Wheelchair: Manual Wheelchair activity did not occur: Safety/medical concerns (pain,  weakness/deconditioning, lethargic)         Wheelchair 50 feet with 2 turns activity    Assist    Wheelchair 50 feet with 2 turns activity did not occur: Safety/medical concerns (pain, weakness/deconditioning, lethargic)       Wheelchair 150 feet activity     Assist  Wheelchair 150 feet activity did not occur: Safety/medical concerns (pain, weakness/deconditioning, lethargic)       Blood pressure (!) 129/107, pulse 88, temperature 98 F (36.7 C), resp. rate 16, height 5\' 2"  (1.575 m), weight 78.8 kg, SpO2 96 %.   Medical Problem List and Plan: 1. Functional deficits secondary to pelvic fracture             -patient may shower             -ELOS/Goals: 20-22 days S/MinA             -Continue CIR 2.  Antithrombotics: -DVT/anticoagulation:  Pharmaceutical: Other (comment)apixaban 2.5 mg for 30 days (stop 6/17)             -antiplatelet therapy: ? restart home aspirin 81 mg 3. Pain Management: Currently receiving scheduled Tylenol and Valium BID. Using Ultram 100 mg approximately once a day and oxycodone 5 mg infrequently (q 4 hours prn). Also has Dilaudid 0.5 mg q 2 hours prn pain not relieved with oral meds (had only one dose yesterday). Robaxin started yesterday. Add vitamin C 1,000mg  daily.  4. Mood: LCSW to evaluate and provide emotional support             -antipsychotic agents: n/a 5. Neuropsych: This patient is capable of making decisions on her own behalf. 6. Skin/Wound Care: Routine skin care checks 7. Fluids/Electrolytes/Nutrition: routine Is and Os and follow-up chemistries             --avoid dairy due to lactose intolerance 8: Pelvic fracture s/p percutaneous fixation             --WBAT on left lower extremity             --TDWB on right lower extremity             --continue Vit D supplement weekly             --plan follow-up x-rays on 2 weeks per ortho 9: ABLA/small RP hematoma: stable; monitor H and H; follow-up CBC 10: Urinary retention: Foley  removed 5/19; monitor for spontaneous void --Flomax started 5/17 0.4 mg daily --Urecholine started 5/16 25 mg  TID 11: Hyperlipidemia: continue Lipitor 12: Hypokalemia: K+ 40 mEq BID; follow-up BMP 13: Hypertension:  --continue home Toprol XL 25 mg q HS             --continue HCTZ 12.5 mg q HS             --continue losartan 100 mg daily             -increase magnesium gluconate to 500mg  HS 14: Lactose intolerant: avoid dairy; Lactaid tabs TID 15. Vitamin D deficiency: Level is 15.32: continue ergocalciferol 50,000U once per week for 7 weeks 16. Fatigue: IM B12 ordered. Modafinil 100mg  daily ordered.  17. Obesity: BMI 31.77: provide dietary education  LOS: 2 days A FACE TO FACE EVALUATION WAS PERFORMED  Jobeth Pangilinan P Deniro Laymon 06/18/2021, 3:01 PM

## 2021-06-19 DIAGNOSIS — M25559 Pain in unspecified hip: Secondary | ICD-10-CM | POA: Diagnosis not present

## 2021-06-19 DIAGNOSIS — D62 Acute posthemorrhagic anemia: Secondary | ICD-10-CM | POA: Diagnosis not present

## 2021-06-19 DIAGNOSIS — S32309D Unspecified fracture of unspecified ilium, subsequent encounter for fracture with routine healing: Secondary | ICD-10-CM

## 2021-06-19 DIAGNOSIS — I10 Essential (primary) hypertension: Secondary | ICD-10-CM | POA: Diagnosis not present

## 2021-06-19 LAB — GLUCOSE, CAPILLARY: Glucose-Capillary: 112 mg/dL — ABNORMAL HIGH (ref 70–99)

## 2021-06-19 MED ORDER — LIDOCAINE 5 % EX PTCH
2.0000 | MEDICATED_PATCH | CUTANEOUS | Status: DC
  Administered 2021-06-19 – 2021-07-01 (×6): 2 via TRANSDERMAL
  Filled 2021-06-19 (×12): qty 2

## 2021-06-19 NOTE — Progress Notes (Signed)
Inpatient Rehabilitation  Patient information reviewed and entered into eRehab system by Elbie Statzer M. Malikai Gut, M.A., CCC/SLP, PPS Coordinator.  Information including medical coding, functional ability and quality indicators will be reviewed and updated through discharge.    

## 2021-06-19 NOTE — IPOC Note (Signed)
Overall Plan of Care [facility]) Patient Details Name: Zoe Arnold MRN: [id number] DOB: 15-Mar-1945  Admitting Diagnosis: Pelvic fracture [facility])  Hospital Problems: Principal Problem:   Pelvic fracture (HCC)     Functional Problem List: Nursing Bladder, Endurance, Pain, Medication Management, Safety, Motor, Bowel  PT Balance, Edema, Endurance, Motor, Nutrition, Pain, Perception, Safety, Skin Integrity  OT    SLP    TR         Basic ADL's: OT Eating, Grooming, Bathing, Dressing, Toileting     Advanced  ADL's: OT       Transfers: PT Bed Mobility, Bed to Chair, Car, Occupational psychologist, Research scientist (life sciences): PT Ambulation, Psychologist, prison and probation services, Stairs     Additional Impairments: OT Fuctional Use of Upper Extremity  SLP        TR      Anticipated Outcomes Item Anticipated Outcome  Self Feeding set-up A  Swallowing      Basic self-care  set-up A to min A  Toileting  min A   Bathroom Transfers min A  Bowel/Bladder  manage bowel w mod I and bladder w min assist  Transfers  supervision with LRAD  Locomotion  CGA with LRAD  Communication     Cognition     Pain  pain at or below level 4 w prns  Safety/Judgment  maintain safety w cues   Therapy Plan: PT Intensity: Minimum of 1-2 x/day ,45 to 90 minutes PT Frequency: 5 out of 7 days PT Duration Estimated Length of Stay: 3 weeks OT Intensity: Minimum of 1-2 x/day, 45 to 90 minutes OT Frequency: Total of 15 hours over 7 days of combined therapies OT Duration/Estimated Length of Stay: 3-3.5 weeks     Team Interventions: Nursing Interventions Patient/Family Education, Bowel Management, Pain Management, Medication Management, Disease Management/Prevention, Bladder Management, Discharge Planning  PT interventions Ambulation/gait training, Discharge planning, Functional mobility training, Psychosocial support, Therapeutic Activities, Visual/perceptual remediation/compensation, Balance/vestibular training,  Disease management/prevention, Neuromuscular re-education, Skin care/wound management, Therapeutic Exercise, Wheelchair propulsion/positioning, Cognitive remediation/compensation, DME/adaptive equipment instruction, Pain management, Splinting/orthotics, UE/LE Strength taining/ROM, Community reintegration, Equities trader education, Museum/gallery curator, UE/LE Coordination activities  OT Interventions Warden/ranger, Neuromuscular re-education, Disease mangement/prevention, Self Care/advanced ADL retraining, Therapeutic Exercise, Wheelchair propulsion/positioning, UE/LE Strength taining/ROM, Pain management, Cognitive remediation/compensation, Firefighter, Equities trader education, UE/LE Coordination activities, Therapeutic Activities, Psychosocial support, Functional mobility training, Discharge planning, DME/adaptive equipment instruction  SLP Interventions    TR Interventions    SW/CM Interventions Discharge Planning, Psychosocial Support, Patient/Family Education   Barriers to Discharge MD  Medical stability and Home enviroment access/loayout  Nursing Decreased caregiver support, Home environment access/layout, 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, 2 ste w spouse (post CVA) and daughter/grandson  PT Inaccessible home environment, Home environment access/layout, Wound Care, Weight bearing restrictions, Other (comments) pain, RLE TDWB precautions, 5 STE with access to 1 railing  OT Decreased caregiver support, Weight bearing restrictions    SLP      SW       Team Discharge Planning: Destination: PT-Home ,OT- Home , SLP-  Projected Follow-up: PT-Home health PT, OT-  Home health OT, SLP-  Projected Equipment Needs: PT-To be determined, OT- To be determined, SLP-  Equipment Details: PT-has none, OT-  Patient/family involved in discharge planning: PT- Patient,  OT-Patient, SLP-   MD ELOS: 20-22 days  Medical Rehab Prognosis:  Excellent Assessment: The patient has  been admitted for CIR therapies with the diagnosis of pelvic fracture. The team will be addressing  functional mobility, strength, stamina, balance, safety, adaptive techniques and equipment, self-care, bowel and bladder mgt, patient and caregiver education. Goals have been set at sup/Mina. Anticipated discharge destination is home.        See Team Conference Notes for weekly updates to the plan of care

## 2021-06-19 NOTE — Progress Notes (Signed)
PROGRESS NOTE   Subjective/Complaints: Reports pain in her pelvis, has not used PRN oxycodone yet. No other complaints or concerns this AM. She is in bed.   ROS: +fatigue, +pain, no CP or SOB  Objective:   No results found. Recent Labs    06/17/21 0520  WBC 9.2  HGB 10.8*  HCT 32.1*  PLT 243    Recent Labs    06/17/21 0520  NA 136  K 4.1  CL 98  CO2 28  GLUCOSE 107*  BUN 10  CREATININE 0.71  CALCIUM 9.3     Intake/Output Summary (Last 24 hours) at 06/19/2021 0731 Last data filed at 06/18/2021 1254 Gross per 24 hour  Intake 420 ml  Output 375 ml  Net 45 ml         Physical Exam: Vital Signs Blood pressure (!) 129/107, pulse 88, temperature 98 F (36.7 C), resp. rate 16, height 5\' 2"  (1.575 m), weight 78.8 kg, SpO2 96 %.  Constitutional:      General: She is not in acute distress.    Appearance: She is obese. BMI 31.77. Somnolent  HENT:     Head: Normocephalic and atraumatic.     Mouth/Throat:     Mouth: Mucous membranes are moist Cardio: Reg rate Chest: normal effort, normal rate of breathing Abdominal:     General: Bowel sounds are normal.     Palpations: Abdomen is soft.  Musculoskeletal:        General: No swelling. RLE 3/5, LLE 4/5.  Skin:    General: Skin is warm and dry.  Neurological:     General: No focal deficit present.     Mental Status: She is alert and oriented to person, place, and time.  Psychiatric:        Mood and Affect: Mood normal.        Behavior: Behavior normal.     Assessment/Plan: 1. Functional deficits which require 3+ hours per day of interdisciplinary therapy in a comprehensive inpatient rehab setting. Physiatrist is providing close team supervision and 24 hour management of active medical problems listed below. Physiatrist and rehab team continue to assess barriers to discharge/monitor patient progress toward functional and medical goals  Care  Tool:  Bathing  Bathing activity did not occur: Safety/medical concerns     Body parts bathed by helper: Buttocks     Bathing assist Assist Level: Dependent - Patient 0%     Upper Body Dressing/Undressing Upper body dressing Upper body dressing/undressing activity did not occur (including orthotics): Safety/medical concerns      Upper body assist      Lower Body Dressing/Undressing Lower body dressing      What is the patient wearing?: Underwear/pull up     Lower body assist Assist for lower body dressing: Total Assistance - Patient < 25%     Toileting Toileting Toileting Activity did not occur Landscape architect and hygiene only): N/A (no void or bm)  Toileting assist       Transfers Chair/bed transfer  Transfers assist  Chair/bed transfer activity did not occur: Safety/medical concerns (pain, weakness/deconditioning, lethargic)        Locomotion Ambulation   Ambulation assist  Ambulation activity did not occur: Safety/medical concerns (pain, weakness/deconditioning, lethargic)          Walk 10 feet activity   Assist  Walk 10 feet activity did not occur: Safety/medical concerns (pain, weakness/deconditioning, lethargic)        Walk 50 feet activity   Assist Walk 50 feet with 2 turns activity did not occur: Safety/medical concerns (pain, weakness/deconditioning, lethargic)         Walk 150 feet activity   Assist Walk 150 feet activity did not occur: Safety/medical concerns (pain, weakness/deconditioning, lethargic)         Walk 10 feet on uneven surface  activity   Assist Walk 10 feet on uneven surfaces activity did not occur: Safety/medical concerns (pain, weakness/deconditioning, lethargic)         Wheelchair     Assist Is the patient using a wheelchair?: Yes Type of Wheelchair: Manual Wheelchair activity did not occur: Safety/medical concerns (pain, weakness/deconditioning, lethargic)         Wheelchair 50  feet with 2 turns activity    Assist    Wheelchair 50 feet with 2 turns activity did not occur: Safety/medical concerns (pain, weakness/deconditioning, lethargic)       Wheelchair 150 feet activity     Assist  Wheelchair 150 feet activity did not occur: Safety/medical concerns (pain, weakness/deconditioning, lethargic)       Blood pressure (!) 129/107, pulse 88, temperature 98 F (36.7 C), resp. rate 16, height 5\' 2"  (1.575 m), weight 78.8 kg, SpO2 96 %.   Medical Problem List and Plan: 1. Functional deficits secondary to pelvic fracture             -patient may shower             -ELOS/Goals: 20-22 days S/MinA             -Continue CIR  -Therapy has been complicated by pain/fatigue 2.  Antithrombotics: -DVT/anticoagulation:  Pharmaceutical: Other (comment)apixaban 2.5 mg for 30 days (stop 6/17)             -antiplatelet therapy: ? restart home aspirin 81 mg 3. Pain Management: Currently receiving scheduled Tylenol and Valium BID. Using Ultram 100 mg approximately once a day and oxycodone 5 mg infrequently (q 4 hours prn). Also has Dilaudid 0.5 mg q 2 hours prn pain not relieved with oral meds (had only one dose yesterday). Robaxin started yesterday. Add vitamin C 1,000mg  daily.   -5/22 Discussed utilization of PRN pain medications 4. Mood: LCSW to evaluate and provide emotional support             -antipsychotic agents: n/a 5. Neuropsych: This patient is capable of making decisions on her own behalf. 6. Skin/Wound Care: Routine skin care checks 7. Fluids/Electrolytes/Nutrition: routine Is and Os and follow-up chemistries             --avoid dairy due to lactose intolerance 8: Pelvic fracture s/p percutaneous fixation             --WBAT on left lower extremity             --TDWB on right lower extremity             --continue Vit D supplement weekly             --plan follow-up x-rays on 2 weeks per ortho 9: ABLA/small RP hematoma: stable; monitor H and H  -f/u CBC  next monday 10: Urinary retention: Foley removed 5/19; monitor for spontaneous void --Flomax  started 5/17 0.4 mg daily --Urecholine started 5/16 25 mg TID 11: Hyperlipidemia: continue Lipitor 12: Hypokalemia: K+ 40 mEq BID; follow-up BMP 13: Hypertension:  --continue home Toprol XL 25 mg q HS             --continue HCTZ 12.5 mg q HS             --continue losartan 100 mg daily             -increase magnesium gluconate to 500mg  HS  -5/22 BP well controlled, continue to monitor 14: Lactose intolerant: avoid dairy; Lactaid tabs TID 15. Vitamin D deficiency: Level is 15.32: continue ergocalciferol 50,000U once per week for 7 weeks 16. Fatigue: IM B12 ordered. Modafinil 100mg  daily ordered.  17. Obesity: BMI 31.77: provide dietary education  LOS: 3 days A FACE TO FACE EVALUATION WAS PERFORMED  Jennye Boroughs 06/19/2021, 7:31 AM

## 2021-06-19 NOTE — Discharge Instructions (Addendum)
Inpatient Rehab Discharge Instructions  Zoe Arnold Discharge date and time: 07/01/2021 Activities/Precautions/ Functional Status: Activity: no lifting, driving, or strenuous exercise until cleared by MD Diet: regular diet Wound Care: keep wound clean and dry, may leave incision open to air Functional status:  ___ No restrictions     ___ Walk up steps independently ___ 24/7 supervision/assistance   ___ Walk up steps with assistance __x_ Intermittent supervision/assistance  ___ Bathe/dress independently ___ Walk with walker     ___ Bathe/dress with assistance ___ Walk Independently    ___ Shower independently ___ Walk with assistance    _x__ Shower with assistance _x__ No alcohol     ___ Return to work/school ________  COMMUNITY REFERRALS UPON DISCHARGE:    Home Health:   PT     OT     ST                   Agency:[facility] Phone: [phone]    Medical Equipment/Items Ordered: Hospital Bed, Bedside Commode, Rolling Walker and Wheelchair                                                 Agency/Supplier: [employer] [phone]   Special Instructions:  No driving, alcohol consumption or tobacco use.   My questions have been answered and I understand these instructions. I will adhere to these goals and the provided educational materials after my discharge from the hospital.  Patient/Caregiver Signature _______________________________ Date __________  Clinician Signature _______________________________________ Date __________  Please bring this form and your medication list with you to all your follow-up doctor's appointments.    ___________________________________________________________________________________________________ Information on my medicine - ELIQUIS (apixaban)  This medication education was reviewed with me or my healthcare representative as part of my discharge preparation.   Why was Eliquis prescribed for you? Eliquis was prescribed for you to reduce the risk of  blood clots forming after orthopedic surgery. The total duration of therapy will be 30-days. You started taking Eliquis on 06/15/2021 and are scheduled to end on 07/14/2021 after the evening dose  What do You need to know about Eliquis? Take your Eliquis TWICE DAILY - one tablet in the morning and one tablet in the evening with or without food.  It would be best to take the dose about the same time each day.  If you have difficulty swallowing the tablet whole please discuss with your pharmacist how to take the medication safely.  Take Eliquis exactly as prescribed by your doctor and DO NOT stop taking Eliquis without talking to the doctor who prescribed the medication.  Stopping without other medication to take the place of Eliquis may increase your risk of developing a clot.  After discharge, you should have regular check-up appointments with your healthcare provider that is prescribing your Eliquis.  What do you do if you miss a dose? If a dose of ELIQUIS is not taken at the scheduled time, take it as soon as possible on the same day and twice-daily administration should be resumed.  The dose should not be doubled to make up for a missed dose.  Do not take more than one tablet of ELIQUIS at the same time.  Important Safety Information A possible side effect of Eliquis is bleeding. You should call your healthcare provider right away if you experience any of the following: Bleeding from an  injury or your nose that does not stop. Unusual colored urine (red or dark brown) or unusual colored stools (red or black). Unusual bruising for unknown reasons. A serious fall or if you hit your head (even if there is no bleeding).  Some medicines may interact with Eliquis and might increase your risk of bleeding or clotting while on Eliquis. To help avoid this, consult your healthcare provider or pharmacist prior to using any new prescription or non-prescription medications, including herbals,  vitamins, non-steroidal anti-inflammatory drugs (NSAIDs) and supplements.  This website has more information on Eliquis (apixaban): http://www.eliquis.com/eliquis/home

## 2021-06-19 NOTE — Progress Notes (Signed)
Occupational Therapy Session Note  Patient Details  Name: Zoe Arnold MRN: [id number] Date of Birth: 04/01/1945  Today's Date: 06/19/2021 OT Individual Time: 3419-6222 & 9798-9211 OT Individual Time Calculation (min): 57 min  & 70 min   Short Term Goals: Week 1:  OT Short Term Goal 1 (Week 1): Pt will transfer to EOB with max A in prep for seated ADL. OT Short Term Goal 2 (Week 1): Pt will self-feed 75% of meal with no more than min A. OT Short Term Goal 3 (Week 1): Pt will complete 1 grooming task with set-up A at bed level. OT Short Term Goal 4 (Week 1): Pt will participate in full OT session in 2/3 attempts.  Skilled Therapeutic Interventions/Progress Updates:  Session 1 Skilled OT intervention completed with focus on re-education of OT purpose, activity tolerance, pain management education. Pt received upright in bed, asleep, requiring multimodal cues to waken. Initial c/o un-rated pain in R hip, nursing/MD made aware, and c/o slight dizziness prior to movement. Education provided about non-pharm pain management methods such as ice, repositioning and breathing techniques, however pt limited by pain during session. Pt also limited with therapy participation 2/2 verbose with suspicion of pt avoiding movement, requiring encouragement and re-directive cues throughout. Able to come to semi-sitting position with use of bilateral side bed rails with BUEs, and mod A for lifting trunk and to maintain during donning of shirt with pt able to thread each arm one at a time, overall max A. NT administering bladder scan with pt >350 ml, with plan to cath pt. Therapist assisting in bed mobility and positioning pt as pt was limited by pain with any BLE movement requiring cues for actively opening legs. Therapist assisting with LB dressing with pt able to lift BLE with mod A at knee and increased time with total A for threading, pt pulling up to hips, then pt able to roll with supervision R<>L when given extra  time and encouragement with use of bed rails and total A for donning over hips. Pt preferring to do things her own way, with difficulty providing cues as pt was not very receptive to education. Pt was left upright in bed, with bed alarm on and all needs in reach at end of session.  Session 2 Skilled OT intervention completed with focus on toilet transfers, activity tolerance, toileting education. Pt received upright in bed, asleep, requiring multimodal cues to waken. No initial c/o pain, however did c/o "loopy" feeling, with therapist assessing BP with the following results: upright in bed & at rest 117/50, sitting EOB: 104/58. Using bilateral leg lifters via gait belts (pt preference), pt was able to transition from upright in bed > EOB with overall mod A for BLE and scooting forward. Pt perseverating on wanting to stand without RW to get to toilet, with max encouragement and re-directive cues needed to alert pt of safety concern about her "plan." Pt compliant with standing with RW, however pt physically trying to push therapist away when attempting to block under pt's R heel for TDWB compliance. Stood with mod A, with pt able to weight shift to L but unable to R, with pt attempting to push RW away to "walk over to the toilet." Difficult to educate pt, however retrieved stedy for toilet transfer 2/2 pt lack of safety awareness/compliance. Completed sit > stand with min guard with use of BUE on grab bar, then dependent transfer <> BSC. Required safety/encouragement cues to bring head/trunk upright as pt wanted to  lean chest/neck on stedy pull up bar. Small smear noted in brief, but no BM. Continent of bladder, releasing about 400 ml with nursing notified. Required increased time for task and initiation of pericare. Total A for threading of brief, then sit > stand from commode with min A and total A for posterior pericare and donning of brief over hips. Sit > stand from perched position with CGA, then min A for  descent to EOB. Max A for bed mobility with pt preferring to use BUE on R bed rail and therapist manage BLE via helicopter method onto bed. Provided gait belts on each foot per request for independence with BLE management, then pt was left upright in bed with bed alarm on and all needs in reach at end of session.  Therapy Documentation Precautions:  Precautions Precautions: Fall Precaution Comments: R hip pain Restrictions Weight Bearing Restrictions: Yes RLE Weight Bearing: Touchdown weight bearing LLE Weight Bearing: Weight bearing as tolerated    Therapy/Group: Individual Therapy  Adylin Hankey E Rashun Grattan 06/19/2021, 7:26 AM

## 2021-06-19 NOTE — Progress Notes (Signed)
Inpatient Rehabilitation Care Coordinator Assessment and Plan Patient Details  Name: Zoe Arnold MRN: [id number] Date of Birth: 04/25/1945  Today's Date: 06/19/2021  Hospital Problems: Principal Problem:   Pelvic fracture [facility])  Past Medical History:  Past Medical History:  Diagnosis Date   Hyperlipidemia    Hypertension    Past Surgical History:  Past Surgical History:  Procedure Laterality Date   ORIF PELVIC FRACTURE Left 06/12/2021   Procedure: OPEN REDUCTION INTERNAL FIXATION (ORIF) PELVIC FRACTURE;  Surgeon: [name], MD;  Location: [facility];  Service: Orthopedics;  Laterality: Left;   Social History:  reports that she quit smoking about 38 years ago. She does not have any smokeless tobacco history on file. She reports that she does not drink alcohol and does not use drugs.  Family / Support Systems Anticipated Caregiver: spouse and family Ability/Limitations of Caregiver: none Family Dynamics: support from spouse and family  Social History Preferred language: English Religion: Curator - How often do you need to have someone help you when you read instructions, pamphlets, or other written material from your doctor or pharmacy?: Never Writes: Yes   Abuse/Neglect Abuse/Neglect Assessment Can Be Completed: Yes Physical Abuse: Denies Verbal Abuse: Denies Sexual Abuse: Denies Exploitation of patient/patient's resources: Denies Self-Neglect: Denies  Patient response to: Social Isolation - How often do you feel lonely or isolated from those around you?: Never  Emotional Status Recent Psychosocial Issues: coping Psychiatric History: n/a Substance Abuse History: n/a  Patient / Family Perceptions, Expectations & Goals Pt/Family understanding of illness & functional limitations: yes Premorbid pt/family roles/activities: previously independent Anticipated changes in roles/activities/participation: spouse and family able to assis t Pt/family  expectations/goals: Multimedia programmer available at discharge: family Is the patient able to respond to transportation needs?: Yes In the past 12 months, has lack of transportation kept you from medical appointments or from getting medications?: No In the past 12 months, has lack of transportation kept you from meetings, work, or from getting things needed for daily living?: No Resource referrals recommended: Neuropsychology  Discharge Planning Living Arrangements: Spouse/significant other, Children Support Systems: Children, Spouse/significant other Type of Residence: Private residence Insurance Resources: Chartered certified accountant Resources: Family Support Financial Screen Referred: Yes Living Expenses: Lives with family Money Management: Patient, Spouse Does the patient have any problems obtaining your medications?: No Home Management: independent Patient/Family Preliminary Plans: family able to assist if needed Care Coordinator Anticipated Follow Up Needs: HH/OP Expected length of stay: 10-14 Days  Clinical Impression SW met with patient, introduced self and explained role. Patient plans to discharge home with assistance from spouse and children. Patient reports being in pain, sw will follow up with physician. No additional questions or concerns, sw will continue to follow up.  Dyanne Iha 06/19/2021, 1:25 PM

## 2021-06-19 NOTE — Progress Notes (Signed)
Patient ID: Zoe Arnold, female   DOB: 03-Oct-1945, 76 y.o.   MRN: [id number] Met with the patient to review rehab process, team conference and plan of care. Discussed current situation and home layout; 5 ste bil rails. Has a w/c and 3N1 commode at home from her mother. Reviewed secondary risks including HTN, HLD along with urinary retention; requiring I+O cath q day and constipation on colace BID. Reviewed getting up to the toilet to empty bladder and decrease need for intermit caths and nurse to give laxative in addition to stool softener. Options for increased nutrition reviewed due to poor appetite. Reviewed Eliquis thru 6/17 per MD. Patient is tired; reports sleeping off and on due to comfort level and mobility limitations. Continue to follow along to discharge to address educational needs and facilitate preparation for discharge. Margarito Liner

## 2021-06-19 NOTE — Progress Notes (Signed)
Physical Therapy Session Note  Patient Details  Name: Zoe Arnold MRN: [id number] Date of Birth: [birth year]  Today's Date: 06/19/2021 PT Individual Time: 1103-1156 PT Individual Time Calculation (min): 53 min  Today's Date: 06/19/2021 PT Missed Time: 22 Minutes Missed Time Reason: Other (Comment) (pt taking phone calls)  Short Term Goals: Week 1:  PT Short Term Goal 1 (Week 1): pt will roll L/R with mod A of 1 PT Short Term Goal 2 (Week 1): pt will transfer sit<>stand with LRAD and max A of 1 PT Short Term Goal 3 (Week 1): pt will transfer bed<>chair with LRAD and max A of 1  Skilled Therapeutic Interventions/Progress Updates:   Received pt sitting upright in bed talking on phone, pt briefly acknowldged PT but proceeded with phone call - therefore offered to return in a few minutes. Upon returning, pt agreeable to PT treatment, and reported pain in R hip is "tolerable" but did not state pain level - repositioning, rest breaks, and activity modification done to reduce pain levels. Pt requesting topical pain relief for R hip - notified MD. Session with emphasis on functional mobility/transfers, pain management, dynamic sitting balance, and generalized strengthening and endurance. Pt requesting to brush teeth and agreed to sit EOB to do so. Of note, pt frequently stalling, taking more phone calls and asking therapist numerous unrelated questions. Provided pt with gait belt to use as R leg lifter (pt requested 2nd one for LLE) and pt transferred semi-reclined<>R sidelying<>sitting EOB with HOB elevated and heavy reliance on bedrails with heavy min A for trunk control and LLE management. Pt initially with R lateral lean, but with increased time able to achieve midline position. Pt initially reported urge to void, but when therapist brought bedside commode over, pt reported being "unsure" if she had to go anymore and feeling "loopy". Reminded pt that she had just been cathed and pt demonstrating poor  safety awareness and problem solving skills and unwilling to listen to therapist's recommendations - therefore deferred commode transfer. Pt was able to progress to standing with RW from elevated EOB with RW and mod A (pulling with BUE on RW). Of note, pt has her own way of doing things and is unreceptive to cues/education for safety. Pt able to remain standing ~25 seconds with min A for balance and appeared to be adhering to RLE TDWB precautions. Returned to sitting and sat EOB and combed hair but then starting leaning all the way onto L elbow (towards footboard) to offload hip and began sliding anteriorly (no awareness of this) - suggested returning to supine but pt unable to return to midline and laid supine, perpendicular in bed. Due to safety, therapist had to total A pivot pt from perpendicular in supine<>parallel as pt not initiating movement herself. Pt initially yelling in pain but within a few seconds calmed down and able to scoot herself to [facility] with supervision, pulling on bedrails. Concluded session with pt semi-reclined in bed, needs within reach, and bed alarm on. 22 minutes missed of skilled physical therapy due to phone call  Of note, pt required SIGNIFICANTLY increased time with  ALL mobility. Pt moves slowly and at her own pace and frequently tends to stall to avoid doing certain tasks.   Therapy Documentation Precautions:  Precautions Precautions: Fall Precaution Comments: R hip pain Restrictions Weight Bearing Restrictions: Yes RLE Weight Bearing: Touchdown weight bearing LLE Weight Bearing: Weight bearing as tolerated  Therapy/Group: Individual Therapy Marlana Salvage Imagene Gurney PT, DPT  06/19/2021, 7:01  AM  

## 2021-06-19 NOTE — Care Management (Signed)
[facility] Individual Statement of Services  Patient Name:  Zoe Arnold  Date:  06/19/2021  Welcome to the [facility]patient [facility].  Our goal is to provide you with an individualized program based on your diagnosis and situation, designed to meet your specific needs.  With this comprehensive rehabilitation program, you will be expected to participate in at least 3 hours of rehabilitation therapies Monday-Friday, with modified therapy programming on the weekends.  Your rehabilitation program will include the following services:  Physical Therapy (PT), Occupational Therapy (OT), 24 hour per day rehabilitation nursing, Therapeutic Recreaction (TR), Psychology, Neuropsychology, Care Coordinator, Rehabilitation Medicine, Nutrition Services, Pharmacy Services, and Other  Weekly team conferences will be held on Wednesdays to discuss your progress.  Your Inpatient Rehabilitation Care Coordinator will talk with you frequently to get your input and to update you on team discussions.  Team conferences with you and your family in attendance may also be held.  Expected length of stay: 3 weeks    Overall anticipated outcome: Supervision  Depending on your progress and recovery, your program may change. Your Inpatient Rehabilitation Care Coordinator will coordinate services and will keep you informed of any changes. Your Inpatient Rehabilitation Care Coordinator's name and contact numbers are listed  below.  The following services may also be recommended but are not provided by the Inpatient Rehabilitation Center:  Driving Evaluations Home Health Rehabiltiation Services Outpatient Rehabilitation Services Vocational Rehabilitation   Arrangements will be made to provide these services after discharge if needed.  Arrangements include referral to agencies that provide these services.  Your insurance has been verified to be:  Medicare A/B  Your primary doctor is:  Rinaldo Cloud  Pertinent information will be shared with your doctor and your insurance company.  Inpatient Rehabilitation Care Coordinator:  Lavera Guise, [place] [phone] or [phone]  Information discussed with and copy given to patient by: Gretchen Short, 06/19/2021, 11:24 AM

## 2021-06-19 NOTE — Discharge Summary (Signed)
Physician Discharge Summary  Patient ID: Zoe Arnold MRN: [id number] DOB/AGE: [birth year] 76 y.o.  Admit date: 06/16/2021 Discharge date: 07/01/2021  Discharge Diagnoses:  Principal Problem:   Pelvic fracture ([place]) Active problems: Debility secondary to pelvic fracture Lactose intolerance ABLA Retroperitoneal hematoma Urinary retention Hyperlipidemia Hypokalemia Hypertension Vitamin D deficiency Fatigue  Discharged Condition: good  Significant Diagnostic Studies:  Labs:  Basic Metabolic Panel: No results for input(s): "NA", "K", "CL", "CO2", "GLUCOSE", "BUN", "CREATININE", "CALCIUM", "MG", "PHOS" in the last 168 hours.   CBC: No results for input(s): "WBC", "NEUTROABS", "HGB", "HCT", "MCV", "PLT" in the last 168 hours.   CBG: No results for input(s): "GLUCAP" in the last 168 hours.   Brief HPI:   Zoe Arnold is a 76 y.o. female involved in [place] on 06/10/2021. Imaging revealed right iliac fracture, right superior and inferior rami fractures and small right retroperitoneal hematoma. She had acute urinary retention and required Foley catheter placement. Fatigue noted and B-12 injections started. Vitamin D deficiency add supplementation weekly. Missed therapy 5/21 due to somnolence and modafinil started on 5/22. Required intermittent straight cath.   Hospital Course: Zoe Arnold was admitted to rehab 06/16/2021 for inpatient therapies to consist of PT, ST and OT at least three hours five days a week. Past admission physiatrist, therapy team and rehab RN have worked together to provide customized collaborative inpatient rehab.  Urecholine started on 5/16 and Flomax started on 5/17 prior to her admission to rehab.  Her Foley was removed and she was able to void spontaneously.  Reported significant fatigue and use of B12 supplementation at home.  B12 intramuscularly ordered.  Started magnesium gluconate 250 mg at bedtime for hypertension.  Started modafinil 5/22 in order to  combat somnolence.  Therapy has been complicated by pain and fatigue.  Discussed utilization of as needed pain medication.  Modafinil increased to 200 mg daily.  UA and urine culture obtained with findings of UTI and Klebsiella and E. coli isolated.  Activities reviewed and Bactrim initiated.  Urecholine was decreased to 5 mg as voiding was better.  The patient was seen in follow-up by orthopedic surgery on 5/24.  Continue touchdown weightbearing on the right lower extremity and weightbearing as tolerated on the left lower extremity. Modafinil decreased to 100 mg due to poor sleep. Valium for pain control was reduced to nighttime dosing only on 5/28 secondary to hypotension. Ortho follow-up  again on 5/31: continue current treatment plans with follow-up in 2 weeks after discharge. May leave incision open to air. Modafinil discontinued on 5/31 due to insomnia.   Blood pressures were monitored on TID basis and remained controlled on Toprol XL, hydrochlorothiazide, losartan.  Initiated magnesium gluconate and increased to 500 mg at bedtime. Developed hypotension and HCTZ discontinued. Reduced losartan to 50 mg on 5/29.  Diabetes has been monitored with ac/hs CBG checks and SSI was use prn for tighter BS control.  Toprol-XL 25 mg nightly, HCTZ 12.5 mg nightly and losartan 100 mg daily continued.  Started magnesium gluconate 500 mg nightly on 5/20.   Rehab course: During patient's stay in rehab weekly team conferences were held to monitor patient's progress, set goals and discuss barriers to discharge. At admission, patient required total assist with basic self-care skills, total assistance +2 with mobility.  She  has had improvement in activity tolerance, balance, postural control as well as ability to compensate for deficits. She has had improvement in functional use RUE/LUE  and RLE/LLE as well as improvement in awareness  Disposition: Home Discharge disposition: 01-Home or Self Care      Diet:  Regular/lactose-free  Special Instructions:  No driving, alcohol consumption or tobacco use.   30-35 minutes were spent on discharge planning and discharge summary.   Discharge Instructions     Ambulatory referral to Physical Medicine Rehab   Complete by: As directed    Hospital follow-up     No driving, alcohol consumption or tobacco use.   Allergies as of 07/01/2021       Reactions   Penicillins Itching, Swelling   Tetracyclines & Related Itching, Swelling        Medication List     STOP taking these medications    aspirin EC 81 MG tablet   diazepam 5 MG tablet Commonly known as: VALIUM   losartan-hydrochlorothiazide 100-12.5 MG tablet Commonly known as: HYZAAR       TAKE these medications    acetaminophen 325 MG tablet Commonly known as: TYLENOL Take 2 tablets (650 mg total) by mouth 3 (three) times daily.   acidophilus Caps capsule Take 1 capsule by mouth daily.   ascorbic acid 1000 MG tablet Commonly known as: VITAMIN C Take 1 tablet (1,000 mg total) by mouth daily.   atorvastatin 20 MG tablet Commonly known as: LIPITOR Take 1 tablet (20 mg total) by mouth daily.   b complex vitamins tablet Take 1 tablet by mouth daily.   Eliquis 2.5 MG Tabs tablet Generic drug: apixaban Take 1 tablet (2.5 mg total) by mouth 2 (two) times daily for 13 days.   gabapentin 100 MG capsule Commonly known as: NEURONTIN Take 1 capsule (100 mg total) by mouth at bedtime.   hydrocortisone 2.5 % rectal cream Commonly known as: ANUSOL-HC Place rectally 3 (three) times daily.   losartan 50 MG tablet Commonly known as: COZAAR Take 1 tablet (50 mg total) by mouth at bedtime.   magnesium gluconate 500 MG tablet Commonly known as: MAGONATE Take 1 tablet (500 mg total) by mouth at bedtime.   metoprolol succinate 25 MG 24 hr tablet Commonly known as: TOPROL-XL Take 1 tablet (25 mg total) by mouth daily.   Muscle Rub 10-15 % Crea Apply 1 application. topically as  needed for muscle pain.   oxyCODONE 5 MG immediate release tablet Commonly known as: Oxy IR/ROXICODONE Take 1-2 tablets (5-10 mg total) by mouth every 4 (four) hours as needed for severe pain.   senna-docusate 8.6-50 MG tablet Commonly known as: Senokot-S Take 2 tablets by mouth at bedtime.   tamsulosin 0.4 MG Caps capsule Commonly known as: FLOMAX Take 1 capsule (0.4 mg total) by mouth daily.   traZODone 50 MG tablet Commonly known as: DESYREL Take 0.5-1 tablets (25-50 mg total) by mouth at bedtime as needed for sleep.   Vitamin D (Ergocalciferol) 1.25 MG (50000 UNIT) Caps capsule Commonly known as: DRISDOL Take 1 capsule (50,000 Units total) by mouth every 7 (seven) days.   zinc oxide 20 % ointment Apply topically as needed for irritation.        Follow-up Information     Charolette Forward, MD. Call.   Specialty: Cardiology Why: Call tomorrow to make arrangements for hospital follow-up appointment Contact information: [address] [place] [address] [phone]         [name], MD. Schedule an appointment as soon as possible for a visit in 2 week(s).   Specialty: Orthopedic Surgery Why: for repeat x-rays Contact information: [facility] [place] [address] [phone]  [name], MD Follow up.   Specialty: Physical Medicine and Rehabilitation Why: office will call you to arrange your appt (sent) Contact information: [address] [place] [address] [phone]                 Signed: Barbie Banner 07/06/2021, 5:40 PM

## 2021-06-20 ENCOUNTER — Other Ambulatory Visit (HOSPITAL_COMMUNITY): Payer: Self-pay

## 2021-06-20 DIAGNOSIS — S32301A Unspecified fracture of right ilium, initial encounter for closed fracture: Secondary | ICD-10-CM | POA: Diagnosis not present

## 2021-06-20 DIAGNOSIS — R5383 Other fatigue: Secondary | ICD-10-CM | POA: Diagnosis not present

## 2021-06-20 DIAGNOSIS — I10 Essential (primary) hypertension: Secondary | ICD-10-CM | POA: Diagnosis not present

## 2021-06-20 LAB — URINALYSIS, ROUTINE W REFLEX MICROSCOPIC
Bilirubin Urine: NEGATIVE
Glucose, UA: NEGATIVE mg/dL
Hgb urine dipstick: NEGATIVE
Ketones, ur: NEGATIVE mg/dL
Nitrite: POSITIVE — AB
Protein, ur: NEGATIVE mg/dL
Specific Gravity, Urine: 1.016 (ref 1.005–1.030)
WBC, UA: >50 WBC/hpf — ABNORMAL HIGH (ref 0–5)
pH: 5 (ref 5.0–8.0)

## 2021-06-20 MED ORDER — BETHANECHOL CHLORIDE 10 MG PO TABS
10.0000 mg | ORAL_TABLET | Freq: Three times a day (TID) | ORAL | Status: DC
  Administered 2021-06-20 – 2021-06-21 (×2): 10 mg via ORAL
  Filled 2021-06-20 (×2): qty 1

## 2021-06-20 MED ORDER — MODAFINIL 100 MG PO TABS
200.0000 mg | ORAL_TABLET | Freq: Every day | ORAL | Status: DC
  Administered 2021-06-21 – 2021-06-27 (×7): 200 mg via ORAL
  Filled 2021-06-20 (×7): qty 2

## 2021-06-20 NOTE — Progress Notes (Signed)
Occupational Therapy Session Note  Patient Details  Name: Zoe Arnold MRN: [id number] Date of Birth: 1945-05-29  Today's Date: 06/20/2021 OT Individual Time: 1005-1100 & [id number] OT Individual Time Calculation (min): 55 min  & 25 min OT missed time: 35 mins Missed time reason: fatigue   Short Term Goals: Week 1:  OT Short Term Goal 1 (Week 1): Pt will transfer to EOB with max A in prep for seated ADL. OT Short Term Goal 2 (Week 1): Pt will self-feed 75% of meal with no more than min A. OT Short Term Goal 3 (Week 1): Pt will complete 1 grooming task with set-up A at bed level. OT Short Term Goal 4 (Week 1): Pt will participate in full OT session in 2/3 attempts.  Skilled Therapeutic Interventions/Progress Updates:  Session 1 Skilled OT intervention completed with focus on activity tolerance, functional transfers, w/c mobility and BUE strengthening. Pt received upright in bed, asleep, easily woken. No initial c/o pain without movement, but up to 6/10 in R hip with movement that improved only with rest breaks and repositioning. Completed bed mobility with mod A, for BLEs as pt only compliant with helicopter method 2/2 pain and preference then light assist for trunk control with use of bed features to EOB. Sit > stand using RW with mod A with pt initially attempting to lean head/trunk on RW and pull up to stand, requiring safety cues needed push up to stand with pt non-receptive to education and pulling up to stand. Stand pivot completed > L side using RW with mod A +2, as pt tried on several accounts to sit prior to safe positioning at w/c, with max encouragement needed to continue with facilitation and manual assist needed for advancing RLE. Mod A for pelvic alignment in w/c. Pt requesting to eat a cracker and drank half her canberry juice with set up A. Seated at sink, pt completed facial and oral hygiene with unreasonable amount of time but supervision, as pt with poor problem solving when  unable to reach items vs anterior leaning. Self-propelled in w/c with slow pace and supervision about 50 ft, following cues for effective propulsion technique. Transported rest of way in w/c with total A 2/2 fatigue to gym and back to room. Completed BUE strengthening with 4 pound dumbbell for bicep flexion and overhead press with therapist stabilizing at wrist for safety as pt with poor stability overhead. Pt was left upright in w/c, with belt alarm on and all needs in reach at end of session.  Session 2 Skilled OT intervention completed with focus on repositioning, pain management, rehab goals and IPR purpose education. Pt received almost sideways lying in bed, with eyes closed but alerted to verbal stimuli. Pt with un-rated pain in R hip, with ice applied for pain management. Encouraged pt to sit EOB however c/o feeling "loopy" and "world spinning". BP assessed with dyn, with pt at 85/58 which is much lower than pt's trend. Pt reporting she doesn't like the foods, and has minimally eaten/drank, with nurse aware of pt status, and pt declining out of bed or therapy intervention at this time. Education provided about importance of consuming meals, or having family bring in items if disliking the food as well as to hydrate as pt is likely dehydrated. Notified care team to see if meds/fluid intervention can be initiated to assist with pt's symptoms.   Encouraged pt to sit EOB for BP management/out of room therapy, however pt declining 2/2 fatigue and status. Pt agreeable  to assist with repositioning in bed for proper pelvic alignment with bed in trendelenburg position and pt able to boost self with mod A. Provided ice to anterior aspect of R hip for pain management and elevated BLE for blood pressure management. Pt was left upright in bed, with bed alarm on and all needs in reach at end of session.    Therapy Documentation Precautions:  Precautions Precautions: Fall Precaution Comments: R hip  pain Restrictions Weight Bearing Restrictions: Yes RLE Weight Bearing: Touchdown weight bearing LLE Weight Bearing: Weight bearing as tolerated    Therapy/Group: Individual Therapy  Tifanie Gardiner E Cymone Yeske 06/20/2021, 7:39 AM

## 2021-06-20 NOTE — Progress Notes (Signed)
PROGRESS NOTE   Subjective/Complaints: Would like a cushion for her sacrum. Messaged [employer] Provided list of foods to help with pain, diabetes, weight loss  ROS: +fatigue, +pain, no CP or SOB, +sacral discomfort  Objective:   No results found. No results for input(s): WBC, HGB, HCT, PLT in the last 72 hours. No results for input(s): NA, K, CL, CO2, GLUCOSE, BUN, CREATININE, CALCIUM in the last 72 hours.  Intake/Output Summary (Last 24 hours) at 06/20/2021 0952 Last data filed at 06/19/2021 1757 Gross per 24 hour  Intake 0 ml  Output --  Net 0 ml        Physical Exam: Vital Signs Blood pressure (!) 111/59, pulse 87, temperature 98.5 F (36.9 C), temperature source Oral, resp. rate 14, height 5\' 2"  (1.575 m), weight 78.8 kg, SpO2 99 %.  Constitutional:      General: She is not in acute distress.    Appearance: She is obese. BMI 31.77. Sitting upright in chair and appears more alert. HENT:     Head: Normocephalic and atraumatic.     Mouth/Throat:     Mouth: Mucous membranes are moist Cardio: Reg rate Chest: normal effort, normal rate of breathing Abdominal:     General: Bowel sounds are normal.     Palpations: Abdomen is soft.  Musculoskeletal:        General: No swelling. RLE 3/5, LLE 4/5.  Skin:    General: Skin is warm and dry.  Neurological:     General: No focal deficit present.     Mental Status: She is alert and oriented to person, place, and time.  Psychiatric:        Mood and Affect: Mood normal.        Behavior: Behavior normal.     Assessment/Plan: 1. Functional deficits which require 3+ hours per day of interdisciplinary therapy in a comprehensive inpatient rehab setting. Physiatrist is providing close team supervision and 24 hour management of active medical problems listed below. Physiatrist and rehab team continue to assess barriers to discharge/monitor patient progress toward functional and  medical goals  Care Tool:  Bathing  Bathing activity did not occur: Safety/medical concerns     Body parts bathed by helper: Buttocks     Bathing assist Assist Level: Dependent - Patient 0%     Upper Body Dressing/Undressing Upper body dressing Upper body dressing/undressing activity did not occur (including orthotics): Safety/medical concerns What is the patient wearing?: Pull over shirt    Upper body assist Assist Level: Maximal Assistance - Patient 25 - 49%    Lower Body Dressing/Undressing Lower body dressing      What is the patient wearing?: Underwear/pull up     Lower body assist Assist for lower body dressing: Maximal Assistance - Patient 25 - 49% (bed level)     Toileting Toileting Toileting Activity did not occur (Clothing management and hygiene only): N/A (no void or bm)  Toileting assist Assist for toileting: Total Assistance - Patient < 25%     Transfers Chair/bed transfer  Transfers assist  Chair/bed transfer activity did not occur: Safety/medical concerns (pain, weakness/deconditioning, lethargic)  Chair/bed transfer assist level: 2 Helpers  Locomotion Ambulation   Ambulation assist   Ambulation activity did not occur: Safety/medical concerns (pain, weakness/deconditioning, lethargic)          Walk 10 feet activity   Assist  Walk 10 feet activity did not occur: Safety/medical concerns (pain, weakness/deconditioning, lethargic)        Walk 50 feet activity   Assist Walk 50 feet with 2 turns activity did not occur: Safety/medical concerns (pain, weakness/deconditioning, lethargic)         Walk 150 feet activity   Assist Walk 150 feet activity did not occur: Safety/medical concerns (pain, weakness/deconditioning, lethargic)         Walk 10 feet on uneven surface  activity   Assist Walk 10 feet on uneven surfaces activity did not occur: Safety/medical concerns (pain, weakness/deconditioning, lethargic)          Wheelchair     Assist Is the patient using a wheelchair?: Yes Type of Wheelchair: Manual Wheelchair activity did not occur: Safety/medical concerns (pain, weakness/deconditioning, lethargic)         Wheelchair 50 feet with 2 turns activity    Assist    Wheelchair 50 feet with 2 turns activity did not occur: Safety/medical concerns (pain, weakness/deconditioning, lethargic)       Wheelchair 150 feet activity     Assist  Wheelchair 150 feet activity did not occur: Safety/medical concerns (pain, weakness/deconditioning, lethargic)       Blood pressure (!) 111/59, pulse 87, temperature 98.5 F (36.9 C), temperature source Oral, resp. rate 14, height 5\' 2"  (1.575 m), weight 78.8 kg, SpO2 99 %.   Medical Problem List and Plan: 1. Functional deficits secondary to pelvic fracture             -patient may shower             -ELOS/Goals: 20-22 days S/MinA             -Continue CIR  -Therapy has been complicated by pain/fatigue 2.  Antithrombotics: -DVT/anticoagulation:  Pharmaceutical: Other (comment)apixaban 2.5 mg for 30 days (stop 6/17)             -antiplatelet therapy: ? restart home aspirin 81 mg 3. Pain Management: Currently receiving scheduled Tylenol and Valium BID. Using Ultram 100 mg approximately once a day and oxycodone 5 mg infrequently (q 4 hours prn). Also has Dilaudid 0.5 mg q 2 hours prn pain not relieved with oral meds (had only one dose yesterday). Robaxin started yesterday. Add vitamin C 1,000mg  daily. Provided list of foods to help with pain.  4. Mood: LCSW to evaluate and provide emotional support             -antipsychotic agents: n/a 5. Neuropsych: This patient is capable of making decisions on her own behalf. 6. Skin/Wound Care: Routine skin care checks 7. Fluids/Electrolytes/Nutrition: routine Is and Os and follow-up chemistries             --avoid dairy due to lactose intolerance 8: Pelvic fracture s/p percutaneous fixation              --WBAT on left lower extremity             --TDWB on right lower extremity             --continue Vit D supplement weekly             --plan follow-up x-rays on 2 weeks per ortho 9: ABLA/small RP hematoma: stable; monitor H and H  -f/u CBC next monday  10: Urinary retention: Foley removed 5/19; monitor for spontaneous void --Flomax started 5/17 0.4 mg daily --Urecholine started 5/16 25 mg TID 11: Hyperlipidemia: continue Lipitor 12: Hypokalemia: K+ 40 mEq BID; follow-up BMP 13: Hypertension:  --continue home Toprol XL 25 mg q HS             --continue HCTZ 12.5 mg q HS             --continue losartan 100 mg daily             -increase magnesium gluconate to 500mg  HS  -5/22 BP well controlled, continue to monitor 14: Lactose intolerant: avoid dairy; Lactaid tabs TID 15. Vitamin D deficiency: Level is 15.32: continue ergocalciferol 50,000U once per week for 7 weeks 16. Fatigue: IM B12 ordered. Increase Modafinil to 200mg  daily.  17. Obesity: BMI 31.77: provided list of foods to help with weight loss.   LOS: 4 days A FACE TO FACE EVALUATION WAS PERFORMED  6/22 Callaghan Laverdure 06/20/2021, 9:52 AM

## 2021-06-20 NOTE — TOC Benefit Eligibility Note (Signed)
Patient Product/process development scientist completed.    The patient is currently admitted and upon discharge could be taking Eliquis 2.5 mg.  The current 14 day co-pay is, $21.84.   The patient is insured through [employer] Part D     Roland Earl, CPhT Pharmacy Patient Advocate Specialist [facility] Patient Advocate Team Direct Number: [phone]  Fax: [phone]

## 2021-06-20 NOTE — Progress Notes (Signed)
Physical Therapy Session Note  Patient Details  Name: Zoe Arnold MRN: [id number] Date of Birth: 02/01/1945  Today's Date: 06/20/2021 PT Individual Time: 0731-0843 PT Individual Time Calculation (min): 72 min   Short Term Goals: Week 1:  PT Short Term Goal 1 (Week 1): pt will roll L/R with mod A of 1 PT Short Term Goal 2 (Week 1): pt will transfer sit<>stand with LRAD and max A of 1 PT Short Term Goal 3 (Week 1): pt will transfer bed<>chair with LRAD and max A of 1  Skilled Therapeutic Interventions/Progress Updates:   Received pt semi-reclined in bed, just waking up, with RN present administering medications. Pt agreeable to PT treatment and stated R hip was "not doing well" but did not rate pain level. Session with emphasis on functional mobility/transfers, toileting, dressing, and dynamic standing balance/coordination. Of note, pt continues to require significantly increased time with all mobility telling therapist she's trying to "avoid moving". Pt reported urge to use restroom and requested to use Stedy. Encouraged pt to try with RW to promote independence and informed pt that she will not be able to discharge home with [facility]. Pt transferred semi-reclined<>sitting EOB with HOB elevated and use of bedrails and mod A for BLE management as pt insisting therapist "help her". Instead of logrolling to sit EOB, pt insisting on laying flat, perpendicular in bed and required mod/max A to "crunch" up to sitting EOB. Of note, pt is unreceptive to therapist's cues and does things "her own way". Pt then stood with RW and mod A from EOB (insisting on pulling up on RW) and transferred bed<>bedside commode stand<>pivot with RW and mod A to L - pt demonstrating good adherence to RLE TDWB precautions but flexing too far forward over RW, not listening to cues from therapist for upright posture. Pt required total A to doff brief and able to void with increased time. Pt requesting therapist "just leave her" to sit  on commode but reported being "done". Educated pt on avoiding spending increased time sitting on commode if she doesn't have to go. Donned brief and pants sitting with max A and pt washed face with set up assist. Sit<>stand from commode with RW and mod A and required total A for peri-care and +2 assist to pull pants over hips. Pt flexing at elbows and leaning too far over RW demonstrating very poor safety awareness. Pt began to transfer bedside commode<>WC to R, but failed to turn all the way and back up to [facility] (despite cues), requiring +2 assist to quickly pull WC underneath her and safely pivot hips into WC to prevent fall. Pt completley unaware, telling therapist "I wasn't going to fall". Pt sat in [place] at sink and brushed teeth and fixed hair with set up assist and significantly increased time; required assist from therapist to put hair in ponytail. Concluded session with pt sitting in WC, needs within reach, and seatbelt alarm on eating breakfast.  Therapy Documentation Precautions:  Precautions Precautions: Fall Precaution Comments: R hip pain Restrictions Weight Bearing Restrictions: Yes RLE Weight Bearing: Touchdown weight bearing LLE Weight Bearing: Weight bearing as tolerated  Therapy/Group: Individual Therapy Alfonse Alpers PT, DPT  06/20/2021, 6:52 AM

## 2021-06-21 DIAGNOSIS — S32301A Unspecified fracture of right ilium, initial encounter for closed fracture: Secondary | ICD-10-CM | POA: Diagnosis not present

## 2021-06-21 DIAGNOSIS — I10 Essential (primary) hypertension: Secondary | ICD-10-CM | POA: Diagnosis not present

## 2021-06-21 DIAGNOSIS — R5383 Other fatigue: Secondary | ICD-10-CM | POA: Diagnosis not present

## 2021-06-21 MED ORDER — HYDROCHLOROTHIAZIDE 12.5 MG PO TABS
6.2500 mg | ORAL_TABLET | Freq: Every day | ORAL | Status: DC
  Administered 2021-06-21: 6.25 mg via ORAL
  Filled 2021-06-21: qty 1

## 2021-06-21 MED ORDER — BETHANECHOL CHLORIDE 10 MG PO TABS
5.0000 mg | ORAL_TABLET | Freq: Three times a day (TID) | ORAL | Status: DC
Start: 2021-06-21 — End: 2021-06-22
  Administered 2021-06-21 – 2021-06-22 (×3): 5 mg via ORAL
  Filled 2021-06-21 (×3): qty 1

## 2021-06-21 MED ORDER — SULFAMETHOXAZOLE-TRIMETHOPRIM 800-160 MG PO TABS
1.0000 | ORAL_TABLET | Freq: Two times a day (BID) | ORAL | Status: AC
Start: 2021-06-21 — End: 2021-06-23
  Administered 2021-06-21 – 2021-06-23 (×6): 1 via ORAL
  Filled 2021-06-21 (×6): qty 1

## 2021-06-21 MED ORDER — ZINC OXIDE 20 % EX OINT
TOPICAL_OINTMENT | CUTANEOUS | Status: DC | PRN
  Filled 2021-06-21: qty 28.35

## 2021-06-21 MED ORDER — MAGNESIUM HYDROXIDE 400 MG/5ML PO SUSP
30.0000 mL | Freq: Every day | ORAL | Status: DC | PRN
  Filled 2021-06-21: qty 30

## 2021-06-21 MED ORDER — RISAQUAD PO CAPS
1.0000 | ORAL_CAPSULE | Freq: Every day | ORAL | Status: DC
  Administered 2021-06-21 – 2021-07-01 (×11): 1 via ORAL
  Filled 2021-06-21 (×11): qty 1

## 2021-06-21 MED ORDER — WITCH HAZEL-GLYCERIN EX PADS: MEDICATED_PAD | CUTANEOUS | Status: DC | PRN

## 2021-06-21 NOTE — Patient Care Conference (Signed)
Inpatient RehabilitationTeam Conference and Plan of Care Update Date: 06/21/2021   Time: 11:40 AM    Patient Name: Zoe Arnold      Medical Record Number: [id number]  Date of Birth: [birth year] Sex: Female         Room/Bed: 4W02C/4W02C-01 Payor Info: Payor: MEDICARE / Plan: MEDICARE PART A AND B / Product Type: *No Product type* /    Admit Date/Time:  06/16/2021  5:03 PM  Primary Diagnosis:  Pelvic fracture [facility])  Hospital Problems: Principal Problem:   Pelvic fracture [facility])    Expected Discharge Date: Expected Discharge Date: 07/01/21  Team Members Present: Physician leading conference: Dr. Sula Soda Social Worker Present: Lavera Guise, BSW Nurse Present: Chana Bode, RN PT Present: Raechel Chute, PT OT Present: Candee Furbish, OT SLP Present: Sarita Bottom, SLP PPS Coordinator present : Fae Pippin, SLP     Current Status/Progress Goal Weekly Team Focus  Bowel/Bladder   cont of band b, tends to hold urine bc too painfull to get up . Hemorrhoids and constipation. toilette when needed and ask pt to void  medicate for pain and assess q shift and prn   Swallow/Nutrition/ Hydration             ADL's   Mod A UB, Max A LB, Max A toileting. Poor safety awareness, tangential and increased pain/loopiness limiting how much can be completed in a session  Grossly min A  functional transfers/endurance, ADL retraining, activity tolerance   Mobility   supine<>sit mod A using bed features and gait belt, sit<>supine max/total A, sit<>stands with RW mod A  supervision, CGA gait, min A steps  bed mobility, pain management, safety awareness, functional mobility/transfers, generalized strengthening and endurance, and standing balance with adherance to RLE TDWB precautions   Communication             Safety/Cognition/ Behavioral Observations            Pain   pt c/o 8/10 pain in pelvis  decrease pain  assess q shift and prn medicate prn   Skin   no current skin break  down           Discharge Planning:  Discharging home with assistance from spouse and children   Team Discussion: Patient with UTI; bactrim initiated with probiotic per MD. Zinc for buttock irritation. Constipation addressed and urinary retention improved. Progress limited by self limiting behavior and resistant to new education, and stalls for therapy and limits mobility.  Patient on target to meet rehab goals: yes, currently mod assist for sit up, completes sit- stand with mod assist but needs +2 assist for stand pivot. Needs mod assist for upper body care and max assist for lower body and toileting. Goals for discharge set for supervision overall, CGA for gait and min assist for bathing.   *See Care Plan and progress notes for long and short-term goals.   Revisions to Treatment Plan:  N/A   Teaching Needs: Safety, transfers, toileting, medications, skin care, etc  Current Barriers to Discharge: Decreased caregiver support and Home enviroment access/layout  Possible Resolutions to Barriers: Family education Ramp recommended for entry     Medical Summary Current Status: decreased energy, obese, hypotension, hemorrhoid  Barriers to Discharge: Medical stability;Weight;Behavior  Barriers to Discharge Comments: fatigue, obese, hypotension, hemorroid Possible Resolutions to [employer] Focus: continue modafinil 200mg  daily, B12 injection administered, provide dietary education, decrease HCTZ to 625mg  HS, wich hazel pads ordered   Continued Need for Acute Rehabilitation Level of Care:  The patient requires daily medical management by a physician with specialized training in physical medicine and rehabilitation for the following reasons: Direction of a multidisciplinary physical rehabilitation program to maximize functional independence : Yes Medical management of patient stability for increased activity during participation in an intensive rehabilitation regime.: Yes Analysis of  laboratory values and/or radiology reports with any subsequent need for medication adjustment and/or medical intervention. : Yes   I attest that I was present, lead the team conference, and concur with the assessment and plan of the team.   Chana Bode B 06/21/2021, 2:51 PM

## 2021-06-21 NOTE — Progress Notes (Signed)
Physical Therapy Session Note  Patient Details  Name: Zoe Arnold MRN: [id number] Date of Birth: 1945-06-30  Today's Date: 06/21/2021 PT Individual Time: 1330-1425 PT Individual Time Calculation (min): 55 min   Short Term Goals: Week 1:  PT Short Term Goal 1 (Week 1): pt will roll L/R with mod A of 1 PT Short Term Goal 2 (Week 1): pt will transfer sit<>stand with LRAD and max A of 1 PT Short Term Goal 3 (Week 1): pt will transfer bed<>chair with LRAD and max A of 1  Skilled Therapeutic Interventions/Progress Updates:   Received pt sitting in recliner with NT present checking vitals - BP:101/63. Pt agreeable to PT treatment and reported pain in buttocks from sitting in one position too long - encouraged pt to stand for pressure relief. Session with emphasis on functional mobility/transfers, generalized strengthening and endurance, WC mobility, and dynamic standing balance/coordination. Pt reported she felt urge to void but stated she hasn't been drinking water but when therapist asked why she hasn't been drinking pt responded with "I don't know". When therapist encouraged drinking more pt stated "I know, I know, I'm not stupid". Set up transfer to commode using RW, however pt insisted on using Stedy. Informed pt that she will not have access to [place] at home and encouraged pt use RW to promote independence and to work on strengthening. Pt then reported that she no longer had to use restroom since therapist would not allow use of Stedy and ultimately refused to toilet despite encouragement. Pt transferred recliner<>WC stand<>pivot to R with mod A (+2 on standby) demonstrating good adherence to RLE TDWB precautions and improvements in transfer technique. Pt performed WC mobility 169ft using BUE and supervision with increased time to dayroom. Encouraged ambulation, however pt reported "not being ready" and expressed that she was not sure if/when she would be ready. Pt stated "I'm in no rush to push what  I don't need to push". Notified pt of D/C date of 6/3 and that we are on a timeline and need to maximize therapy time. Pt requesting therapist write down D/C date and current recommendations. Pt ultimately refused to ambulate, therefore discussed home setup. Discussed if WC will fit around home and through doorways. Pt showed therapist pictures of bathroom and it appears WC may fit. Pt transported back to room in [facility] dependently and therapist demonstrated technique for walking, pt verbalized understanding but declined attempting today but telling therapist "i'm 99% sure that I can do that" - emphasized importance of practice with skilled assist prior to trying on her own. Then discussed having family come in for family education training prior to discharge, to which pt responded with "no, we know what to do". Concluded session with pt sitting in WC, needs within reach, and seatbelt alarm on.   Therapy Documentation Precautions:  Precautions Precautions: Fall Precaution Comments: R hip pain Restrictions Weight Bearing Restrictions: Yes RLE Weight Bearing: Touchdown weight bearing LLE Weight Bearing: Weight bearing as tolerated  Therapy/Group: Individual Therapy Martin Majestic PT, DPT  06/21/2021, 7:05 AM
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
## 2021-06-21 NOTE — Progress Notes (Signed)
Physical Therapy Session Note  Patient Details  Name: Zoe Arnold MRN: [id number] Date of Birth: August 31, 1945  Today's Date: 06/21/2021 PT Individual Time: 5456-2563 PT Individual Time Calculation (min): 30 min  Today's Date: 06/21/2021 PT Co-Treatment Time: 8937-3428 PT Co-Treatment Time Calculation (min): 60 min, and Today's Date: 06/21/2021  Short Term Goals: Week 1:  PT Short Term Goal 1 (Week 1): pt will roll L/R with mod A of 1 PT Short Term Goal 2 (Week 1): pt will transfer sit<>stand with LRAD and max A of 1 PT Short Term Goal 3 (Week 1): pt will transfer bed<>chair with LRAD and max A of 1  Skilled Therapeutic Interventions/Progress Updates:   Pt seen for co-treatment with OT. Received pt sitting on commode in [place]. Pt agreeable to treatment and reported pain 5/10 in R hip/pelvis (premedicated). Session with emphasis on functional mobility/transfers, generalized strengthening and endurance, and dynamic standing balance/coordination. Provided pt with 18x18 Roho cushion for improved comfort while OT assisted with toileting. Pt initially requesting to return to bed due to fatigue, but with encouragement agreed to sit in [facility] to trial new cushion and take BP due to reports of feeling "faint". BP: 87/55 - donned ted hose dependently and BP increased to 100/52. Educated pt on importance of remaining upright to regulate BP, for improved alertness, and for generalized strengthening and pt agreed to transfer to recliner reluctantly. Sit<>stand with RW and mod A of 1 with +2 on standby and BP standing: 116/92 with HR 132bpm. Pt transferred WC<>recliner stand<>pivot with RW and mod A +2, again upon approaching recliner, pt failed to back up completley resulting in therapist having to pivot hips safely back in WC with max A. Educated pt on importance of listening to cues and importance of transfer techniques; however pt brushed off therapist's cues. BP sitting after transfer: 76/57 and after 2-3 minute  rest increased to 86/63 (pt reported relief in symptoms). Notified MD of request of abdominal binder. Extensive discussion had regarding D/C plan and recommendation for getting ramp. However, pt unreceptive to recommendation stating "I don't need a ramp" despite therapist educating pt on RLE TDWB restrictions - pt may benefit from planned failure attempt. Pt did state that her goals are to return home independently, but then proceeded to say that she is in no hurry to leave. Concluded session with pt sitting in recliner, needs within reach, and seatbelt alarm on.   Therapy Documentation Precautions:  Precautions Precautions: Fall Precaution Comments: R hip pain Restrictions Weight Bearing Restrictions: Yes RUE Weight Bearing: Non weight bearing LUE Weight Bearing: Non weight bearing RLE Weight Bearing: Touchdown weight bearing LLE Weight Bearing: Weight bearing as tolerated   Therapy/Group: Co-Treatment Martin Majestic PT, DPT  06/21/2021, 7:06 AM

## 2021-06-21 NOTE — Progress Notes (Signed)
Occupational Therapy Session Note  Patient Details  Name: Zoe Arnold MRN: [id number] Date of Birth: 14-Jun-1945  Today's Date: 06/21/2021 OT Individual Time: [id number] OT Individual Time Calculation (min): 30 min , Today's Date: 06/21/2021 OT Co-Treatment Time: [id number] OT Co-Treatment Time Calculation (min): 60 min, and Today's Date: 06/21/2021 OT Missed Time: 14 Minutes Missed Time Reason: Patient fatigue   Short Term Goals: Week 1:  OT Short Term Goal 1 (Week 1): Pt will transfer to EOB with max A in prep for seated ADL. OT Short Term Goal 2 (Week 1): Pt will self-feed 75% of meal with no more than min A. OT Short Term Goal 3 (Week 1): Pt will complete 1 grooming task with set-up A at bed level. OT Short Term Goal 4 (Week 1): Pt will participate in full OT session in 2/3 attempts.  Skilled Therapeutic Interventions/Progress Updates:  Skilled OT intervention completed with focus on co-treat with PT with emphasis on toileting, toilet and other functional transfers, and BP management. Pt received seated on commode, reported being finished. Continent BM and void detected. Upon standing pt was found to have very large hemorrhoid of the rectal region, with nurse in room to assess and request for hemorrhoid cream to be ordered by MD. Completed sit > stand in stedy with min A, pt flexed trunk on stedy pull up bars, with max to total A needed for pericare, donning of sacral dressing per nursing request, and donning of pants/brief over hips. Dependent transfer in stedy with CGA for trunk balance to w/c. Pt requiring increased time prior to transitions, with c/o feeling dizzy. Sit > stand from perched position in stedy with CGA then min A for descent to w/c. BP assessed: 87/55 - donned ted hose dependently and BP increased to 100/52. Educated pt on importance of remaining upright to regulate BP, for improved alertness, and for generalized strengthening and pt agreed to transfer to recliner reluctantly.  Sit<>stand with RW and mod A of 1 with +2 on standby and BP standing: 116/92 with HR 132bpm. Pt transferred WC<>recliner stand<>pivot with RW and mod A +2, again upon approaching recliner, pt failed to back up completley resulting in therapist having to pivot hips safely back in [place] with max A. Educated pt on importance of listening to cues and importance of transfer techniques; however pt brushed off therapist's cues. BP sitting after transfer: 76/57 and after 5 minute rest increased to 86/63 (pt reported relief in symptoms). Notified MD of request of abdominal binder. Extensive conversation and education provided about assist level needed, what is available at home for self-care assist, safety concerns etc. Pt not very receptive to education at this time, with therapist ensuring proper pelvic alignment of hips into recliner while seated on roho cushion. BLE elevated for BP management. Pt was left upright in recliner, with bottle of water encouraging pt to drink fluids, belt alarm on and all needs in reach at end of session.   Therapy Documentation Precautions:  Precautions Precautions: Fall Precaution Comments: R hip pain Restrictions Weight Bearing Restrictions: Yes RLE Weight Bearing: Touchdown weight bearing LLE Weight Bearing: Weight bearing as tolerated    Therapy/Group: Co-Treatment  Moriyah Byington E Jenner Rosier 06/21/2021, 7:44 AM

## 2021-06-21 NOTE — Progress Notes (Signed)
Occupational Therapy Session Note  Patient Details  Name: Zoe Arnold MRN: [id number] Date of Birth: 07/03/1945  Today's Date: 06/21/2021 OT Individual Time: 0900-1000 OT Individual Time Calculation (min): 60 min    Short Term Goals: Week 1:  OT Short Term Goal 1 (Week 1): Pt will transfer to EOB with max A in prep for seated ADL. OT Short Term Goal 2 (Week 1): Pt will self-feed 75% of meal with no more than min A. OT Short Term Goal 3 (Week 1): Pt will complete 1 grooming task with set-up A at bed level. OT Short Term Goal 4 (Week 1): Pt will participate in full OT session in 2/3 attempts.  Skilled Therapeutic Interventions/Progress Updates:   S: Pt reports that she slept really good last night and that was the first time in a long time.    O: - Bed mobility: supine to sit completed with HOB elevated, bed railings used. Completed with Mod assist. - Bed to wheelchair transfer: completed at Mod assist with performing a lateral scoot transfer towards her left side. NWB status was maintained during transfer. Pt was able to reposition herself in the w/c without assistance.  - UB dressing and bathing completed while seated at sink with set-up. - LB bathing and dressing completed with total assist while seated at sink. - Stedy used to complete toilet transfer. Patient was able to hold bar on Stedy and pull herself up to standing with supervision. Nurse assisted with bringing stedy over threshold in bathroom.  - Grooming: Washed face with set up while seated at sink.   A: Patient participated in skilled OT session focusing on ADL re-training and functional mobility/transfers. No complaint of pain was verbalized during session. Able to transition to seated on EOB with OT gradually sliding her right then left leg to the edge. Holding onto OT's wrist, patient was able to pull herself up completely. Maintained static sitting balance with supervision with both feet supported on floor and one  extremity providing support on bed. Patient experienced difficulty maintaining WB status when standing in [place] and transferring to toilet. Patient did require additional prompts to maintain attention to self care tasks. Would frequently pause when conversing with OT although was pleasant during session and very receptive to education. Included patient in problem solving during ADL activities and allowed her to provide suggestions.    P: Continue to work on increasing independence with LB bathing and dressing. Introduce reacher and long handled sponge. Increase ability to maintain WB status during transfers especially with use of Stedy.    Therapy Documentation Precautions:  Precautions Precautions: Fall Precaution Comments: Weightbearing restrictions Restrictions Weight Bearing Restrictions: Yes RLE Weight Bearing: Touchdown weight bearing LLE Weight Bearing: Weight bearing as tolerated  Pain: Pain Assessment Pain Scale: 0-10 Pain Score: 0-No pain    Therapy/Group: Individual Therapy  Limmie Patricia, OTR/L,CBIS  Supplemental OT - [facility] and [facility]  06/21/2021, 10:40 AM

## 2021-06-21 NOTE — Progress Notes (Signed)
Orthopaedic Trauma Progress Note  SUBJECTIVE: Doing okay today.  Feels like she is making slow improvements with therapies. Really taking this time to rest and recover to prepare herself for returning home. Has not been eating a ton while in the hospital. Anticipated d/c date 07/01/21.  OBJECTIVE:  General: Sitting up in wheelchair, no acute distress Respiratory: No increased work of breathing.  Pelvis/right lower extremity: Incisions clean, dry, intact.  No significant tenderness over the hip or anteriorly over the pelvis.  Ankle DF/PF intact bilaterally.  Endorses sensation throughout extremity.  Neurovascularly intact  IMAGING: Stable post op imaging. Plan for repeat pelvic x-rays 06/26/21  ASSESSMENT: Zoe Arnold is a 76 y.o. female s/p OPEN REDUCTION INTERNAL FIXATION PELVIC FRACTURE 06/12/21   PLAN: Weightbearing: TDWB RLE. WBAT LLE ROM: Unrestricted range of motion BLE Incisional and dressing care: Okay to leave open to air Showering: Incisions may get wet Orthopedic device(s): None  Pain management: Continue current regimen VTE prophylaxis: Eliquis Impediments to Fracture Healing: Vitamin D level 15, continue supplementation  Dispo: Continue care per CIR.  Follow - up plan: 2 weeks after d/c for wound check and repeat x-rays   Contact information:  Katha Hamming MD, Rushie Nyhan PA-C. After hours and holidays please check [email or web address] for group call information for [facility]   Gwinda Passe, PA-C [phone] (office) [email or web address]

## 2021-06-21 NOTE — Progress Notes (Signed)
PROGRESS NOTE   Subjective/Complaints: Feeling better today Discussed positive UA- starting Bactrim and probiotic.  Discussed decreasing   ROS: +fatigue, +pain, no CP or SOB, +sacral discomfort, improved appetite  Objective:   No results found. No results for input(s): WBC, HGB, HCT, PLT in the last 72 hours. No results for input(s): NA, K, CL, CO2, GLUCOSE, BUN, CREATININE, CALCIUM in the last 72 hours.  Intake/Output Summary (Last 24 hours) at 06/21/2021 1046 Last data filed at 06/21/2021 0039 Gross per 24 hour  Intake 0 ml  Output 0 ml  Net 0 ml        Physical Exam: Vital Signs Blood pressure (!) 91/46, pulse 81, temperature 98.4 F (36.9 C), temperature source Oral, resp. rate 20, height 5\' 2"  (1.575 m), weight 78.8 kg, SpO2 97 %.  Constitutional:      General: She is not in acute distress.    Appearance: She is obese. BMI 31.77. Sitting upright in chair and appears more alert. HENT:     Head: Normocephalic and atraumatic.     Mouth/Throat:     Mouth: Mucous membranes are moist Cardio: Reg rate Chest: normal effort, normal rate of breathing Abdominal:     General: Bowel sounds are normal.     Palpations: Abdomen is soft.  Musculoskeletal:        General: No swelling. RLE 3/5, LLE 4/5.  Skin:    General: Skin is warm and dry. +MASD to sacrum Neurological:     General: No focal deficit present.     Mental Status: She is alert and oriented to person, place, and time.  Psychiatric:        Mood and Affect: Mood normal.        Behavior: Behavior normal.     Assessment/Plan: 1. Functional deficits which require 3+ hours per day of interdisciplinary therapy in a comprehensive inpatient rehab setting. Physiatrist is providing close team supervision and 24 hour management of active medical problems listed below. Physiatrist and rehab team continue to assess barriers to discharge/monitor patient progress toward  functional and medical goals  Care Tool:  Bathing  Bathing activity did not occur: Safety/medical concerns     Body parts bathed by helper: Buttocks     Bathing assist Assist Level: Dependent - Patient 0%     Upper Body Dressing/Undressing Upper body dressing Upper body dressing/undressing activity did not occur (including orthotics): Safety/medical concerns What is the patient wearing?: Pull over shirt    Upper body assist Assist Level: Maximal Assistance - Patient 25 - 49%    Lower Body Dressing/Undressing Lower body dressing      What is the patient wearing?: Underwear/pull up     Lower body assist Assist for lower body dressing: Maximal Assistance - Patient 25 - 49% (bed level)     Toileting Toileting Toileting Activity did not occur (Clothing management and hygiene only): N/A (no void or bm)  Toileting assist Assist for toileting: Total Assistance - Patient < 25%     Transfers Chair/bed transfer  Transfers assist  Chair/bed transfer activity did not occur: Safety/medical concerns (pain, weakness/deconditioning, lethargic)  Chair/bed transfer assist level: 2 Helpers     Locomotion  Ambulation   Ambulation assist   Ambulation activity did not occur: Safety/medical concerns (pain, weakness/deconditioning, lethargic)          Walk 10 feet activity   Assist  Walk 10 feet activity did not occur: Safety/medical concerns (pain, weakness/deconditioning, lethargic)        Walk 50 feet activity   Assist Walk 50 feet with 2 turns activity did not occur: Safety/medical concerns (pain, weakness/deconditioning, lethargic)         Walk 150 feet activity   Assist Walk 150 feet activity did not occur: Safety/medical concerns (pain, weakness/deconditioning, lethargic)         Walk 10 feet on uneven surface  activity   Assist Walk 10 feet on uneven surfaces activity did not occur: Safety/medical concerns (pain, weakness/deconditioning,  lethargic)         Wheelchair     Assist Is the patient using a wheelchair?: Yes Type of Wheelchair: Manual Wheelchair activity did not occur: Safety/medical concerns (pain, weakness/deconditioning, lethargic)         Wheelchair 50 feet with 2 turns activity    Assist    Wheelchair 50 feet with 2 turns activity did not occur: Safety/medical concerns (pain, weakness/deconditioning, lethargic)       Wheelchair 150 feet activity     Assist  Wheelchair 150 feet activity did not occur: Safety/medical concerns (pain, weakness/deconditioning, lethargic)       Blood pressure (!) 91/46, pulse 81, temperature 98.4 F (36.9 C), temperature source Oral, resp. rate 20, height 5\' 2"  (1.575 m), weight 78.8 kg, SpO2 97 %.   Medical Problem List and Plan: 1. Functional deficits secondary to pelvic fracture             -patient may shower             -ELOS/Goals: 20-22 days S/MinA             -Continue CIR  -Therapy has been complicated by pain/fatigue 2.  Antithrombotics: -DVT/anticoagulation:  Pharmaceutical: Other (comment)apixaban 2.5 mg for 30 days (stop 6/17)             -antiplatelet therapy: ? restart home aspirin 81 mg 3. Pain Management: Currently receiving scheduled Tylenol and Valium BID. Using Ultram 100 mg approximately once a day and oxycodone 5 mg infrequently (q 4 hours prn). Also has Dilaudid 0.5 mg q 2 hours prn pain not relieved with oral meds (had only one dose yesterday). Robaxin started yesterday. Add vitamin C 1,000mg  daily. Provided list of foods to help with pain.  4. Mood: LCSW to evaluate and provide emotional support             -antipsychotic agents: n/a 5. Neuropsych: This patient is capable of making decisions on her own behalf. 6. Skin/Wound Care: Routine skin care checks 7. Fluids/Electrolytes/Nutrition: routine Is and Os and follow-up chemistries             --avoid dairy due to lactose intolerance 8: Pelvic fracture s/p percutaneous  fixation             --WBAT on left lower extremity             --TDWB on right lower extremity             --continue Vit D supplement weekly             --plan follow-up x-rays on 2 weeks per ortho 9: ABLA/small RP hematoma: stable; monitor H and H  -f/u CBC next monday 10:  Urinary retention: Foley removed 5/19; monitor for spontaneous void --Flomax started 5/17 0.4 mg daily --Urecholine started 5/16 25 mg TID 11: Hyperlipidemia: continue Lipitor 12: Hypokalemia: K+ 40 mEq BID; follow-up BMP 13: Hypertension:  --continue home Toprol XL 25 mg q HS             --decrease HCTZ to 6.25mg  q HS             --continue losartan 100 mg daily             -increase magnesium gluconate to 500mg  HS 14: Lactose intolerant: avoid dairy; Lactaid tabs TID 15. Vitamin D deficiency: Level is 15.32: continue ergocalciferol 50,000U once per week for 7 weeks 16. Fatigue: IM B12 ordered. Increase Modafinil to 200mg  daily. Discussed with patient.  17. Obesity: BMI 31.77: provided list of foods to help with weight loss.  18. UTI: start Bactrim 19. Confusion: wean urecholine to 5mg  TID since voiding better   LOS: 5 days A FACE TO FACE EVALUATION WAS PERFORMED  Zoe Arnold 06/21/2021, 10:46 AM

## 2021-06-22 LAB — URINE CULTURE: Culture: >=100000 — AB

## 2021-06-22 MED ORDER — BETHANECHOL CHLORIDE 10 MG PO TABS
5.0000 mg | ORAL_TABLET | Freq: Two times a day (BID) | ORAL | Status: DC
Start: 2021-06-22 — End: 2021-06-23
  Administered 2021-06-22 – 2021-06-23 (×2): 5 mg via ORAL
  Filled 2021-06-22 (×2): qty 1

## 2021-06-22 NOTE — Progress Notes (Signed)
Recreational Therapy Session Note  Patient Details  Name: Zoe Arnold MRN: [id number] Date of Birth: July 02, 1945 Today's Date: 06/22/2021  Pain: no c/o Skilled Therapeutic Interventions/Progress Updates: Pt actively participated in stress managment/coping group today.  Pt education/discussion focused on stress exploration including factors that contribute to stress, factors that protect against stress and potential coping strategies.  Coping strategies included deep breathing, progressive muscle relaxation, imagery & challenging irrational thoughts.  Handouts provided.   Therapy/Group: Group Therapy   Wannetta Langland 06/22/2021, 4:11 PM

## 2021-06-22 NOTE — Progress Notes (Signed)
PROGRESS NOTE   Subjective/Complaints: Working with rec therapy Apprears much brighter, discussed the Modafinil stimulant Continues to have pain with therapy  ROS: +fatigue, +pain, no CP or SOB, +sacral discomfort, improved appetite  Objective:   No results found. No results for input(s): WBC, HGB, HCT, PLT in the last 72 hours. No results for input(s): NA, K, CL, CO2, GLUCOSE, BUN, CREATININE, CALCIUM in the last 72 hours.  Intake/Output Summary (Last 24 hours) at 06/22/2021 1043 Last data filed at 06/22/2021 0842 Gross per 24 hour  Intake 240 ml  Output --  Net 240 ml        Physical Exam: Vital Signs Blood pressure (!) 91/46, pulse 81, temperature 98.4 F (36.9 C), temperature source Oral, resp. rate 20, height 5\' 2"  (1.575 m), weight 78.8 kg, SpO2 97 %.  Constitutional:      General: She is not in acute distress.    Appearance: She is obese. BMI 31.77. Sitting upright in chair and appears more alert. HENT:     Head: Normocephalic and atraumatic.     Mouth/Throat:     Mouth: Mucous membranes are moist Cardio: Reg rate Chest: normal effort, normal rate of breathing Abdominal:     General: Bowel sounds are normal.     Palpations: Abdomen is soft.  Musculoskeletal:        General: No swelling. RLE 3/5, LLE 4/5.  Skin:    General: Skin is warm and dry. +MASD to sacrum. Incisions c/d/i Neurological:     General: No focal deficit present.     Mental Status: She is alert and oriented to person, place, and time.  Psychiatric:        Mood and Affect: Mood normal.        Behavior: Behavior normal.     Assessment/Plan: 1. Functional deficits which require 3+ hours per day of interdisciplinary therapy in a comprehensive inpatient rehab setting. Physiatrist is providing close team supervision and 24 hour management of active medical problems listed below. Physiatrist and rehab team continue to assess barriers to  discharge/monitor patient progress toward functional and medical goals  Care Tool:  Bathing  Bathing activity did not occur: Safety/medical concerns     Body parts bathed by helper: Buttocks     Bathing assist Assist Level: Dependent - Patient 0%     Upper Body Dressing/Undressing Upper body dressing Upper body dressing/undressing activity did not occur (including orthotics): Safety/medical concerns What is the patient wearing?: Pull over shirt    Upper body assist Assist Level: Maximal Assistance - Patient 25 - 49%    Lower Body Dressing/Undressing Lower body dressing      What is the patient wearing?: Underwear/pull up     Lower body assist Assist for lower body dressing: Maximal Assistance - Patient 25 - 49% (bed level)     Toileting Toileting Toileting Activity did not occur (Clothing management and hygiene only): N/A (no void or bm)  Toileting assist Assist for toileting: Maximal Assistance - Patient 25 - 49%     Transfers Chair/bed transfer  Transfers assist  Chair/bed transfer activity did not occur: Safety/medical concerns (pain, weakness/deconditioning, lethargic)  Chair/bed transfer assist level: 2 Helpers  Locomotion Ambulation   Ambulation assist   Ambulation activity did not occur: Safety/medical concerns (pain, weakness/deconditioning, lethargic)          Walk 10 feet activity   Assist  Walk 10 feet activity did not occur: Safety/medical concerns (pain, weakness/deconditioning, lethargic)        Walk 50 feet activity   Assist Walk 50 feet with 2 turns activity did not occur: Safety/medical concerns (pain, weakness/deconditioning, lethargic)         Walk 150 feet activity   Assist Walk 150 feet activity did not occur: Safety/medical concerns (pain, weakness/deconditioning, lethargic)         Walk 10 feet on uneven surface  activity   Assist Walk 10 feet on uneven surfaces activity did not occur: Safety/medical  concerns (pain, weakness/deconditioning, lethargic)         Wheelchair     Assist Is the patient using a wheelchair?: Yes Type of Wheelchair: Manual Wheelchair activity did not occur: Safety/medical concerns (pain, weakness/deconditioning, lethargic)  Wheelchair assist level: Supervision/Verbal cueing Max wheelchair distance: 166ft    Wheelchair 50 feet with 2 turns activity    Assist    Wheelchair 50 feet with 2 turns activity did not occur: Safety/medical concerns (pain, weakness/deconditioning, lethargic)   Assist Level: Supervision/Verbal cueing   Wheelchair 150 feet activity     Assist  Wheelchair 150 feet activity did not occur: Safety/medical concerns (pain, weakness/deconditioning, lethargic)       Blood pressure (!) 91/46, pulse 81, temperature 98.4 F (36.9 C), temperature source Oral, resp. rate 20, height 5\' 2"  (1.575 m), weight 78.8 kg, SpO2 97 %.   Medical Problem List and Plan: 1. Functional deficits secondary to pelvic fracture             -patient may shower             -ELOS/Goals: 20-22 days S/MinA             -Continue CIR  -Therapy has been complicated by pain/fatigue 2.  Antithrombotics: -DVT/anticoagulation:  Pharmaceutical: Other (comment)apixaban 2.5 mg for 30 days (stop 6/17)             -antiplatelet therapy: ? restart home aspirin 81 mg 3. Pain Management: Currently receiving scheduled Tylenol and Valium BID. Using Ultram 100 mg approximately once a day and oxycodone 5 mg infrequently (q 4 hours prn). Also has Dilaudid 0.5 mg q 2 hours prn pain not relieved with oral meds (had only one dose yesterday). Robaxin started yesterday. Add vitamin C 1,000mg  daily. Provided list of foods to help with pain.  4. Mood: LCSW to evaluate and provide emotional support             -antipsychotic agents: n/a 5. Neuropsych: This patient is capable of making decisions on her own behalf. 6. Skin/Wound Care: Routine skin care checks 7.  Fluids/Electrolytes/Nutrition: routine Is and Os and follow-up chemistries             --avoid dairy due to lactose intolerance 8: Pelvic fracture s/p percutaneous fixation             --WBAT on left lower extremity             --TDWB on right lower extremity             --continue Vit D supplement weekly             --plan follow-up x-rays on 2 weeks per ortho 9: ABLA/small RP hematoma: stable; monitor H  and H  -f/u CBC next monday 10: Urinary retention: Foley removed 5/19; monitor for spontaneous void --Flomax started 5/17 0.4 mg daily --Urecholine started 5/16 25 mg TID 11: Hyperlipidemia: continue Lipitor 12: Hypokalemia: K+ 40 mEq BID; follow-up BMP 13: Hypertension:  --continue home Toprol XL 25 mg q HS             --d/c HCTZ              --continue losartan 100 mg daily             -increase magnesium gluconate to 500mg  HS 14: Lactose intolerant: avoid dairy; Lactaid tabs TID 15. Vitamin D deficiency: Level is 15.32: continue ergocalciferol 50,000U once per week for 7 weeks 16. Fatigue: IM B12 ordered. Increase Modafinil to 200mg  daily. Discussed with patient.  17. Obesity: BMI 31.77: provided list of foods to help with weight loss.  18. UTI: >100,000 Klebsiella, start Bactrim, f/u sensitivies 19. Confusion: wean urecholine to 5mg  BID since voiding better   LOS: 6 days A FACE TO FACE EVALUATION WAS PERFORMED  Zoe Arnold 06/22/2021, 10:43 AM

## 2021-06-22 NOTE — Progress Notes (Signed)
Patient ID: Zoe Arnold, female   DOB: 1945-03-07, 76 y.o.   MRN: [id number]  Sw made attempt to see patient. Patient no in room, SW will continue to follow up.

## 2021-06-22 NOTE — Progress Notes (Signed)
Occupational Therapy Session Note  Patient Details  Name: Zoe Arnold MRN: [id number] Date of Birth: [birth year]  Today's Date: 06/22/2021 OT Individual Time: 1350-1515 OT Individual Time Calculation (min): 85 min    Short Term Goals: Week 1:  OT Short Term Goal 1 (Week 1): Pt will transfer to EOB with max A in prep for seated ADL. OT Short Term Goal 2 (Week 1): Pt will self-feed 75% of meal with no more than min A. OT Short Term Goal 3 (Week 1): Pt will complete 1 grooming task with set-up A at bed level. OT Short Term Goal 4 (Week 1): Pt will participate in full OT session in 2/3 attempts.  Skilled Therapeutic Interventions/Progress Updates:    Patient received seated in wheelchair.  Patient in hospital gown.  Per patient, family brought in clothing, but patient declines wearing street clothes.  "Too hard for me to pull up or down in the bathroom.  I don't want to create work for anyone."  Long discussion about the many benefits of practicing getting dressed - build strength, mobility, activity tolerance, reduce pain, etc.  Patient encouraged to have family bring in housecoats/ dresses she could pull over head which may be easier for toileting.  Demonstrated and practiced with reacher and sock aide to doff/don hose and socks.  Patient has a sock aide at home but has not used it.  Did not know how to use it correctly.   Attempted lateral transfer to drop arm commode when patient indicated need to void.  Patient initially agreeable, but then opted for [facility] transfer.  Assisted patient to bathroom for continent void.  Patient unable to assist with hygiene.   Patient assisted back to bed as this was last therapy.  All immediate needs attended to, and call bell, laptop, phone in reach.  Bed alarm set.    Therapy Documentation Precautions:  Precautions Precautions: Fall Precaution Comments: Weightbearing restrictions Restrictions Weight Bearing Restrictions: Yes RUE Weight Bearing: Non  weight bearing LUE Weight Bearing: Non weight bearing RLE Weight Bearing: Touchdown weight bearing LLE Weight Bearing: Weight bearing as tolerated  Pain:  Reports pain in buttocks/vagina area from prolonged sitting.   Not scored.  Patient assisted to change position    Therapy/Group: Individual Therapy  Collier Salina 06/22/2021, 3:30 PM

## 2021-06-22 NOTE — Progress Notes (Signed)
Physical Therapy Session Note  Patient Details  Name: Zoe Arnold MRN: [id number] Date of Birth: [birth year]  Today's Date: 06/22/2021 PT Individual Time: 0930-1045 PT Individual Time Calculation (min): 75 min   Short Term Goals: Week 1:  PT Short Term Goal 1 (Week 1): pt will roll L/R with mod A of 1 PT Short Term Goal 2 (Week 1): pt will transfer sit<>stand with LRAD and max A of 1 PT Short Term Goal 3 (Week 1): pt will transfer bed<>chair with LRAD and max A of 1  Skilled Therapeutic Interventions/Progress Updates:      Pt seen supine in bed to start. RN at bedside administering morning Rx. Pt denies resting pain. She requires more than a reasonable amount of time to take her pills, one by one. She also was very hesitant to participate in therapy session, requiring max encouragement from PT as well as from her RN. She was somewhat difficult to reason with, reporting she needed to rest more than do activity. Ultimately, she was agreeable. Supine<>sitting EOB with HOB raised, modA for BLE management and trunk support. Able to sit unsupported at EOB with SBA. Donned knee-high compression socks with totalA for time. Pt requesting to use Stedy to transfer to the toilet - educated her on importance of using RW and grab bars rather than relying on Stedy as she won't have this device at home - she reports insurance will cover it and this PT explained that typically insurances do not cover these devices and they are $$ - she reports her lawyer will take care of it. Stand<>pivot transfer to w/c with modA for trunk support due to excessive forward trunk lean - poor compliance with TDWB to RLE. Wheeled to bathroom and she completed a similar transfer to [facility] over toilet, using grab bars. Pt with soiled brief and this was changed. Wheeled sinkside per her request where she completed a few self care tasks such as brushing her teeth, combing hair, etc - all completed with setupA. Pt concluded session seated in  w/c with safety belt alarm on, call bell in reach, all needs met. RN updated on pt status.   Therapy Documentation Precautions:  Precautions Precautions: Fall Precaution Comments: Weightbearing restrictions Restrictions Weight Bearing Restrictions: Yes RUE Weight Bearing: Non weight bearing LUE Weight Bearing: Non weight bearing RLE Weight Bearing: Touchdown weight bearing LLE Weight Bearing: Weight bearing as tolerated General:    Therapy/Group: Individual Therapy  [name] [name] 06/22/2021, 7:45 AM

## 2021-06-22 NOTE — Progress Notes (Signed)
Occupational Therapy Session Note  Patient Details  Name: Zoe Arnold MRN: [id number] Date of Birth: [birth year]  Today's Date: 06/22/2021 OT Group Time: 1100-1211 OT Group Time Calculation (min): 71 min   Short Term Goals: Week 1:  OT Short Term Goal 1 (Week 1): Pt will transfer to EOB with max A in prep for seated ADL. OT Short Term Goal 2 (Week 1): Pt will self-feed 75% of meal with no more than min A. OT Short Term Goal 3 (Week 1): Pt will complete 1 grooming task with set-up A at bed level. OT Short Term Goal 4 (Week 1): Pt will participate in full OT session in 2/3 attempts.  Skilled Therapeutic Interventions/Progress Updates:  Pt participated in group session with a focus on stress mgmt, education on healthy coping strategies, and social interaction. Focus of session on providing coping strategies to manage new diagnosis to allow for improved mental health to increase overall quality of life . Discussed how to break down stressors into "daily hassles," "major life stressors" and "life circumstances" in an effort to allow pts to chunk their stressors into groups and determine where to best put their efforts/time when dealing with stress. Pt actively sharing stressors and contributing to group conversation. Provided active listening, emotional support and therapeutic use of self. Offered education on factors that protect [place] against stress such as "daily uplifts," "healthy coping strategies" and "protective factors." Encouraged all group members to make an effort to actively recall one event from their day that was a daily uplift in an effort to protect their mindset from stressors as well as sharing this information with their caregivers to facilitate improved caregiver communication and decrease overall burden of care.  Issued pt handouts on healthy coping strategies to implement into routine. Pt transported back to room by OTR.  Therapy Documentation Precautions:   Precautions Precautions: Fall Precaution Comments: Weightbearing restrictions Restrictions Weight Bearing Restrictions: Yes RUE Weight Bearing: Non weight bearing LUE Weight Bearing: Non weight bearing RLE Weight Bearing: Touchdown weight bearing LLE Weight Bearing: Weight bearing as tolerated   Pain: No pain reported during session    Therapy/Group: Group Therapy  Barron Schmid 06/22/2021, 12:22 PM

## 2021-06-23 MED ORDER — DIAZEPAM 5 MG PO TABS
5.0000 mg | ORAL_TABLET | Freq: Every day | ORAL | Status: DC
Start: 2021-06-24 — End: 2021-06-28
  Administered 2021-06-24 – 2021-06-27 (×4): 5 mg via ORAL
  Filled 2021-06-23 (×4): qty 1

## 2021-06-23 MED ORDER — BETHANECHOL CHLORIDE 10 MG PO TABS
5.0000 mg | ORAL_TABLET | Freq: Every day | ORAL | Status: DC
  Administered 2021-06-24 – 2021-06-26 (×3): 5 mg via ORAL
  Filled 2021-06-23 (×3): qty 1

## 2021-06-23 NOTE — Progress Notes (Signed)
PROGRESS NOTE   Subjective/Complaints: No new complaints this morning Participated well in stress management session today Discussed UC results with her  ROS: +fatigue- improved, +pain, no CP or SOB, +sacral discomfort, improved appetite  Objective:   No results found. No results for input(s): WBC, HGB, HCT, PLT in the last 72 hours. No results for input(s): NA, K, CL, CO2, GLUCOSE, BUN, CREATININE, CALCIUM in the last 72 hours.  Intake/Output Summary (Last 24 hours) at 06/23/2021 1137 Last data filed at 06/23/2021 0830 Gross per 24 hour  Intake 380 ml  Output --  Net 380 ml        Physical Exam: Vital Signs Blood pressure (!) 105/53, pulse 72, temperature 98 F (36.7 C), resp. rate 14, height 5\' 2"  (1.575 m), weight 78.8 kg, SpO2 97 %.  Constitutional:      General: She is not in acute distress.    Appearance: She is obese. BMI 31.77. Sitting upright in chair and appears more alert. HENT:     Head: Normocephalic and atraumatic.     Mouth/Throat:     Mouth: Mucous membranes are moist Cardio: Reg rate Chest: normal effort, normal rate of breathing Abdominal:     General: Bowel sounds are normal.     Palpations: Abdomen is soft.  Musculoskeletal:        General: No swelling. RLE 3/5, LLE 4/5.  Skin:    General: Skin is warm and dry. +MASD to sacrum. Incisions c/d/i Neurological:     General: No focal deficit present.     Mental Status: She is alert and oriented to person, place, and time.  Slow at taking medications in the morning Psychiatric:        Mood and Affect: Mood normal.        Behavior: Behavior normal.     Assessment/Plan: 1. Functional deficits which require 3+ hours per day of interdisciplinary therapy in a comprehensive inpatient rehab setting. Physiatrist is providing close team supervision and 24 hour management of active medical problems listed below. Physiatrist and rehab team continue to  assess barriers to discharge/monitor patient progress toward functional and medical goals  Care Tool:  Bathing  Bathing activity did not occur: Safety/medical concerns Body parts bathed by patient: Face   Body parts bathed by helper: Buttocks     Bathing assist Assist Level: Set up assist     Upper Body Dressing/Undressing Upper body dressing Upper body dressing/undressing activity did not occur (including orthotics): Safety/medical concerns What is the patient wearing?: Dress    Upper body assist Assist Level: Minimal Assistance - Patient > 75%    Lower Body Dressing/Undressing Lower body dressing      What is the patient wearing?: Incontinence brief     Lower body assist Assist for lower body dressing: Maximal Assistance - Patient 25 - 49% (stance)     Toileting Toileting Toileting Activity did not occur (Clothing management and hygiene only): N/A (no void or bm)  Toileting assist Assist for toileting: Maximal Assistance - Patient 25 - 49%     Transfers Chair/bed transfer  Transfers assist  Chair/bed transfer activity did not occur: Safety/medical concerns (pain, weakness/deconditioning, lethargic)  Chair/bed transfer  assist level: 2 Helpers     Locomotion Ambulation   Ambulation assist   Ambulation activity did not occur: Safety/medical concerns (pain, weakness/deconditioning, lethargic)          Walk 10 feet activity   Assist  Walk 10 feet activity did not occur: Safety/medical concerns (pain, weakness/deconditioning, lethargic)        Walk 50 feet activity   Assist Walk 50 feet with 2 turns activity did not occur: Safety/medical concerns (pain, weakness/deconditioning, lethargic)         Walk 150 feet activity   Assist Walk 150 feet activity did not occur: Safety/medical concerns (pain, weakness/deconditioning, lethargic)         Walk 10 feet on uneven surface  activity   Assist Walk 10 feet on uneven surfaces activity did  not occur: Safety/medical concerns (pain, weakness/deconditioning, lethargic)         Wheelchair     Assist Is the patient using a wheelchair?: Yes Type of Wheelchair: Manual Wheelchair activity did not occur: Safety/medical concerns (pain, weakness/deconditioning, lethargic)  Wheelchair assist level: Supervision/Verbal cueing Max wheelchair distance: 112ft    Wheelchair 50 feet with 2 turns activity    Assist    Wheelchair 50 feet with 2 turns activity did not occur: Safety/medical concerns (pain, weakness/deconditioning, lethargic)   Assist Level: Supervision/Verbal cueing   Wheelchair 150 feet activity     Assist  Wheelchair 150 feet activity did not occur: Safety/medical concerns (pain, weakness/deconditioning, lethargic)       Blood pressure (!) 105/53, pulse 72, temperature 98 F (36.7 C), resp. rate 14, height 5\' 2"  (1.575 m), weight 78.8 kg, SpO2 97 %.   Medical Problem List and Plan: 1. Functional deficits secondary to pelvic fracture             -patient may shower             -ELOS/Goals: 20-22 days S/MinA             -Continue CIR  -Therapy has been complicated by pain/fatigue, but this is improving.  2.  Antithrombotics: -DVT/anticoagulation:  Pharmaceutical: Other (comment)apixaban 2.5 mg for 30 days (stop 6/17)             -antiplatelet therapy: ? restart home aspirin 81 mg 3. Pain Management: Currently receiving scheduled Tylenol and Valium BID. Using Ultram 100 mg approximately once a day and oxycodone 5 mg infrequently (q 4 hours prn). Also has Dilaudid 0.5 mg q 2 hours prn pain not relieved with oral meds (had only one dose yesterday). Robaxin started yesterday. Add vitamin C 1,000mg  daily. Provided list of foods to help with pain.  4. Mood: LCSW to evaluate and provide emotional support             -antipsychotic agents: n/a 5. Neuropsych: This patient is capable of making decisions on her own behalf. 6. Skin/Wound Care: Routine skin care  checks 7. Fluids/Electrolytes/Nutrition: routine Is and Os and follow-up chemistries             --avoid dairy due to lactose intolerance 8: Pelvic fracture s/p percutaneous fixation             --WBAT on left lower extremity             --TDWB on right lower extremity             --continue Vit D supplement weekly             --plan follow-up x-rays on 2  weeks per ortho 9: ABLA/small RP hematoma: stable; monitor H and H  -f/u CBC next monday 10: Urinary retention: Foley removed 5/19; monitor for spontaneous void --Flomax started 5/17 0.4 mg daily Resolved, weaning medications 11: Hyperlipidemia: continue Lipitor 12: Hypokalemia: K+ 40 mEq BID; follow-up BMP 13: Hypotension w/ history of HTN --continue home Toprol XL 25 mg q HS             --d/c HCTZ              --continue losartan 100 mg daily             -increase magnesium gluconate to 500mg  HS  -decrease valium to HS 14: Lactose intolerant: avoid dairy; Lactaid tabs TID 15. Vitamin D deficiency: Level is 15.32: continue ergocalciferol 50,000U once per week for 7 weeks 16. Fatigue: IM B12 ordered. Increase Modafinil to 200mg  daily. Discussed with patient.  17. Obesity: BMI 31.77: provided list of foods to help with weight loss.  18. UTI: >100,000 Klebsiella, start Bactrim, sensitive to Bactrim, discussed with patient.  19. Confusion: wean urecholine to 5mg  daily since voiding better   LOS: 7 days A FACE TO FACE EVALUATION WAS PERFORMED  Zoe Arnold 06/23/2021, 11:37 AM

## 2021-06-23 NOTE — Progress Notes (Signed)
Physical Therapy Session Note  Patient Details  Name: Zoe Arnold MRN: [id number] Date of Birth: [birth year]  Today's Date: 06/23/2021 PT Individual Time: [id number] PT Individual Time Calculation (min): 70 min   Short Term Goals: Week 1:  PT Short Term Goal 1 (Week 1): pt will roll L/R with mod A of 1 PT Short Term Goal 2 (Week 1): pt will transfer sit<>stand with LRAD and max A of 1 PT Short Term Goal 3 (Week 1): pt will transfer bed<>chair with LRAD and max A of 1  Skilled Therapeutic Interventions/Progress Updates:   Received pt sitting in WC, pt agreeable to PT treatment, and reported pain 6-7/10 in R hip/pelvis (premedicated) and fatigue from being up. Session with emphasis on functional mobility/transfers, generalized strengthening and endurance, and dynamic standing balance/coordination. Pt found sitting with just robe on; suggested putting on pants to work on standing in gym and pt reluctantly agreed with maximal encouragement. Donned pants sitting with max A and stood with RW and mod A and required max A to pull pants over hips. Pt leaning over RW and attempting to lean on elbows while standing - requiring continued education on RW safety. Pt requiring an unreasonable amount of time to prepare to go to gym, insisting on therapist placing pillow under buttocks and handing her numerous items. Recreational therapy joined session and pt performed WC mobility >179ft using BUE and supervision to dayroom with emphasis on UE strength. Discussion had regarding pt's goals for rehab and if she has a goal to ambulate. Pt reported that she expected to be walking here in rehab, but despite therapist and recreational therapist encouraging pt to attempt, pt refused but was agreeable to practice standing. Sit<>stand with RW and mod A x 2 trials (+2 on standby). Pt initially starting holding onto front of RW rather than handgrips but did reposition handgrip with max cues. Pt able to achieve upright  positioning for the first time today but appeared to be placing more than TDWB on RLE. Pt declined taking small hop in place on LLE due to feeling "swimmy headed". Returned to sitting and BP: 161/144 and HR 162bpm. Re-assessed: 165/151 - unsure of accuracy. RN called to bedside and reported needing to try larger red cuff. Re-assessed with larger cuff and BP: 104/50. Due to inability to get accurate BP, returned to room and RN checked BP manually: 98/53 and pt asymptomatic. Concluded session with pt sitting in WC, needs within reach, and seatbelt alarm on.   Therapy Documentation Precautions:  Precautions Precautions: Fall Precaution Comments: Weightbearing restrictions Restrictions Weight Bearing Restrictions: Yes RLE Weight Bearing: Touchdown weight bearing LLE Weight Bearing: Weight bearing as tolerated  Therapy/Group: Individual Therapy Alfonse Alpers PT, DPT  06/23/2021, 7:07 AM

## 2021-06-23 NOTE — Progress Notes (Signed)
Occupational Therapy Session Note  Patient Details  Name: Zoe Arnold MRN: [id number] Date of Birth: September 26, 1945  Today's Date: 06/23/2021 OT Individual Time: 0905-1000 & 1300-1410 OT Individual Time Calculation (min): 55 min & 70 min   Short Term Goals: Week 1:  OT Short Term Goal 1 (Week 1): Pt will transfer to EOB with max A in prep for seated ADL. OT Short Term Goal 2 (Week 1): Pt will self-feed 75% of meal with no more than min A. OT Short Term Goal 3 (Week 1): Pt will complete 1 grooming task with set-up A at bed level. OT Short Term Goal 4 (Week 1): Pt will participate in full OT session in 2/3 attempts.  Skilled Therapeutic Interventions/Progress Updates:  Session 1 Skilled OT intervention completed with focus on toilet transfers, toileting and safety education. Pt received upright in bed, with nurse present administering meds, with unreasonable amount of time to consume. 6/10 pain in R hip only with movement per report, pre-medicated, however no increase in pain or expression of pain during transfers. Pt reporting desire to use bathroom, specifically requesting using stedy, however re-education provided to pt about therapy goals with prohibited use of stedy with therapist as pt unable to use stedy at home. Pt with poor receptiveness or awareness of current situation and approaching d/c date stating "Oh we can pay whatever for that thing, my lawyer will get it for me." Completed bed mobility with min A for BLE using helicopter method only for advancing to over EOB. C/o "loopiness" at EOB however unable to find dynamap, however donned TEDS dependently for BP management. Sit > stand with mod A using RW, stand pivot using RW > w/c with mod A, with poor TDWB adherence on RLE and compliance with bringing chest up during transfer with bilateral hands on RW vs elbows with lack of safety awareness. Continent void on commode with increased time needed for relaxation as pt presenting with difficulty  releasing urine. Threaded brief with total A for time. Sit > stand using RW mod A then total A for pericare with pt preferring to flex at hip about 90 degrees on RW- unreceptive to safety cues requiring CGA for standing balance, then mod A stand pivot to w/c with same safety cues as before. Donned personal gown with min A, then provided facial hygiene items for completion with set up A. Pt was left seated in w/c, with BLE elevated, belt alarm on and all needs in reach at end of session.  Session 2 Skilled OT intervention completed with focus on toilet transfers, toileting. Pt received seated in w/c, requesting assist to toilet. Dependently transported in w/c to bathroom, with sit > stand and stand pivot using RW with mod A to BSC. Lowered the BSC legs so that pt's BLE can have leverage for standing from commode. Total A needed for doffing pants. Pt was continent of bladder with increased time for relaxation to void successfully. Threaded brief with total A for time, then sit > stand mod A using RW, with therapist applying pain patches to bilateral buttocks per nursing, total A for pericare, max A for donning over hips, and then max A stand pivot to w/c with total A needed to place hips as pt had posterior LOB and required physical assist to safely position in w/c. Seated at sink, completed hand hygiene with set up, then noticeable slurring of speech with therapist checking vitals as follows: 114/53, pulse 80. Encouraged pt to drink fluids, with pt consuming whole  cup of cranberry juice. Pt declining bathing or out of room therapy. Agreeable to assist back to bed, with pt transferring at same as assist as above. Max A for bed mobility. Max A needed for repositioning in bed, with bed in trendelenburg position. Pt preferring to have bilateral gait belts applied to each leg for independence with moving legs. Pt was left upright in bed, with bed alarm on and all needs in reach at end of session.   Therapy  Documentation Precautions:  Precautions Precautions: Fall Precaution Comments: Weightbearing restrictions Restrictions Weight Bearing Restrictions: Yes RLE Weight Bearing: Touchdown weight bearing LLE Weight Bearing: Weight bearing as tolerated    Therapy/Group: Individual Therapy  Farid Grigorian E Tiya Schrupp 06/23/2021, 7:38 AM

## 2021-06-23 NOTE — Progress Notes (Signed)
Occupational Therapy Weekly Progress Note  Patient Details  Name: Zoe Arnold MRN: [id number] Date of Birth: 18-Dec-1945  Beginning of progress report period: Jun 17, 2021 End of progress report period: Jun 23, 2021  Patient has met 4 of 4 short term goals.  Pt is making steady progress towards LTGs. Pt has progressed functional transfers from use of stedy with +2 assist, to mod A stand pivot using RW, is able to bathe at an overall mod A level, dress at an overall Max A level and requires max assist for toileting tasks. Pt continues to be demonstrate self-limiting behaviors that have limited therapy progression, however extensive education provided about safety during transfers, approaching d/c to motivate pt and how to maximize independence with self-care. Pt makes steady improvements, when she is receptive to education provided. Pt has a low pain tolerance and is easily distractible. Pt would benefit from continued OT services to maximize pt's safety at home, as pt's family is only able to assist at reported min A level and needs to complete hands on training to better assist pt at d/c.  Patient continues to demonstrate the following deficits: muscle weakness, decreased cardiorespiratoy endurance, decreased coordination, decreased initiation, decreased awareness, decreased problem solving, decreased safety awareness, decreased memory, and delayed processing, and decreased standing balance and difficulty maintaining precautions and therefore will continue to benefit from skilled OT intervention to enhance overall performance with BADL and Reduce care partner burden.  Patient progressing toward long term goals..  Continue plan of care.  OT Short Term Goals Week 1:  OT Short Term Goal 1 (Week 1): Pt will transfer to EOB with max A in prep for seated ADL. OT Short Term Goal 1 - Progress (Week 1): Met OT Short Term Goal 2 (Week 1): Pt will self-feed 75% of meal with no more than min A. OT Short  Term Goal 2 - Progress (Week 1): Met OT Short Term Goal 3 (Week 1): Pt will complete 1 grooming task with set-up A at bed level. OT Short Term Goal 3 - Progress (Week 1): Met OT Short Term Goal 4 (Week 1): Pt will participate in full OT session in 2/3 attempts. OT Short Term Goal 4 - Progress (Week 1): Met Week 2:  OT Short Term Goal 1 (Week 2): STG = LTG 2/2 ELOS   Atasha Colebank E Queena Monrreal 06/23/2021, 7:41 AM

## 2021-06-23 NOTE — Progress Notes (Signed)
Recreational Therapy Session Note  Patient Details  Name: Zoe Arnold MRN: [id number] Date of Birth: 1945-05-26 Today's Date: 06/23/2021  Pain: c/o6-7/10 R hip/pelvis pain, premedicated Skilled Therapeutic Interventions/Progress Updates: Session focused on providing emotional support/encouragement to pt during cotreat with PT.  Pt propelled w/c using BUEs from her room to the dayroom with supervision, verbal cues for encouragement.  Attempted goal discussion, but pt dismissive of goals stating that they could handle things when she goes home.  Discussed the importance of mobility tasks and their progression during continued CIR stay.  Pt stated understanding but declined ambulation attempt.  PT asking if ambulation is no longer a focus/goal and pt reports she expected to be walking by now.  Pt reports pain and anticipation of pain as the limiting factor.  Also discussed recommendation of a w/c ramp and pt states that they will be ok and figure it out when she's home, that she took care of her mother so she/they know what to do.  Discussed that ramps could be rented vs built if pt and family prefer.  More discussion needed with pt and family on ramp building and care at discharge.  Pt was agreeable to attempt standing.  Pt stood x2 with Mod assist and max encouragement (+2 on standby for safety).  Pt with reports of feeling "swimmy headed."  Attempted BP check with dynamap but unsure of results.  RN made aware, cuff adjusted but readings still questionable.  Manual BP taken by RN and reported as 98/52.  Pt reported feeling ok at that time.    Therapy/Group: Co-Treatment   Leidy Massar 06/23/2021, 12:17 PM

## 2021-06-24 ENCOUNTER — Inpatient Hospital Stay (HOSPITAL_COMMUNITY): Payer: Medicare Other

## 2021-06-24 NOTE — Progress Notes (Signed)
PROGRESS NOTE   Subjective/Complaints:  Pt reports doing "good".  Just woke up-  Sore in vaginal area.  LBM was 2 days ago- but prior to 2 days ago- not eating much.  Best sleep overnight- doing better this AM.  KUB doesn't show large stool burden.  Patches helped pain.    ROS:  Pt denies SOB, abd pain, CP, N/V/C/D, and vision changes   Objective:   DG Abd 1 View  Result Date: 06/24/2021 CLINICAL DATA:  Constipation EXAM: ABDOMEN - 1 VIEW COMPARISON:  06/12/2021 FINDINGS: Nonobstructive pattern of bowel gas. No large burden of stool. 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 Screw fixation of acute appearing fractures of the bilateral sacroiliac joints and right pubic rami. IMPRESSION: Nonobstructive pattern of bowel gas. No large burden of stool. Electronically Signed   By: Jearld LeschAlex D Bibbey M.D.   On: 06/24/2021 11:30   No results for input(s): WBC, HGB, HCT, PLT in the last 72 hours. No results for input(s): NA, K, CL, CO2, GLUCOSE, BUN, CREATININE, CALCIUM in the last 72 hours.  Intake/Output Summary (Last 24 hours) at 06/24/2021 1330 Last data filed at 06/23/2021 1837 Gross per 24 hour  Intake 240 ml  Output --  Net 240 ml        Physical Exam: Vital Signs Blood pressure 91/64, pulse 81, temperature 98 F (36.7 C), resp. rate 16, height 5\' 2"  (1.575 m), weight 78.2 kg, SpO2 92 %.    General: awake, alert, appropriate, sitting up slightly in bed; NAD HENT: conjugate gaze; oropharynx moist CV: regular rate; no JVD Pulmonary: CTA B/L; no W/R/R- good air movement GI: soft, NT, ND, (+)BS Psychiatric: appropriate Neurological: Ox3  Musculoskeletal:        General: No swelling. RLE 3/5, LLE 4/5.  Skin:    General: Skin is warm and dry. +MASD to sacrum. Incisions c/d/i Neurological:     General: No focal deficit present.     Mental Status: She is alert and oriented to person, place, and time.  Slow at taking  medications in the morning Psychiatric:        Mood and Affect: Mood normal.        Behavior: Behavior normal.     Assessment/Plan: 1. Functional deficits which require 3+ hours per day of interdisciplinary therapy in a comprehensive inpatient rehab setting. Physiatrist is providing close team supervision and 24 hour management of active medical problems listed below. Physiatrist and rehab team continue to assess barriers to discharge/monitor patient progress toward functional and medical goals  Care Tool:  Bathing  Bathing activity did not occur: Safety/medical concerns Body parts bathed by patient: Face   Body parts bathed by helper: Buttocks     Bathing assist Assist Level: Set up assist     Upper Body Dressing/Undressing Upper body dressing Upper body dressing/undressing activity did not occur (including orthotics): Safety/medical concerns What is the patient wearing?: Dress    Upper body assist Assist Level: Minimal Assistance - Patient > 75%    Lower Body Dressing/Undressing Lower body dressing      What is the patient wearing?: Incontinence brief     Lower body assist Assist for lower body dressing:  Maximal Assistance - Patient 25 - 49% (stance)     Toileting Toileting Toileting Activity did not occur (Clothing management and hygiene only): N/A (no void or bm)  Toileting assist Assist for toileting: Maximal Assistance - Patient 25 - 49%     Transfers Chair/bed transfer  Transfers assist  Chair/bed transfer activity did not occur: Safety/medical concerns (pain, weakness/deconditioning, lethargic)  Chair/bed transfer assist level: 2 Helpers     Locomotion Ambulation   Ambulation assist   Ambulation activity did not occur: Safety/medical concerns (pain, weakness/deconditioning, lethargic)          Walk 10 feet activity   Assist  Walk 10 feet activity did not occur: Safety/medical concerns (pain, weakness/deconditioning, lethargic)         Walk 50 feet activity   Assist Walk 50 feet with 2 turns activity did not occur: Safety/medical concerns (pain, weakness/deconditioning, lethargic)         Walk 150 feet activity   Assist Walk 150 feet activity did not occur: Safety/medical concerns (pain, weakness/deconditioning, lethargic)         Walk 10 feet on uneven surface  activity   Assist Walk 10 feet on uneven surfaces activity did not occur: Safety/medical concerns (pain, weakness/deconditioning, lethargic)         Wheelchair     Assist Is the patient using a wheelchair?: Yes Type of Wheelchair: Manual Wheelchair activity did not occur: Safety/medical concerns (pain, weakness/deconditioning, lethargic)  Wheelchair assist level: Supervision/Verbal cueing Max wheelchair distance: >166ft    Wheelchair 50 feet with 2 turns activity    Assist    Wheelchair 50 feet with 2 turns activity did not occur: Safety/medical concerns (pain, weakness/deconditioning, lethargic)   Assist Level: Supervision/Verbal cueing   Wheelchair 150 feet activity     Assist  Wheelchair 150 feet activity did not occur: Safety/medical concerns (pain, weakness/deconditioning, lethargic)   Assist Level: Supervision/Verbal cueing   Blood pressure 91/64, pulse 81, temperature 98 F (36.7 C), resp. rate 16, height 5\' 2"  (1.575 m), weight 78.2 kg, SpO2 92 %.   Medical Problem List and Plan: 1. Functional deficits secondary to pelvic fracture             -patient may shower             -ELOS/Goals: 20-22 days S/MinA             -Continue CIR  -Therapy has been complicated by pain/fatigue, but this is improving.   Sletp great- feeling better- con't CIR- PT and OT 2.  Antithrombotics: -DVT/anticoagulation:  Pharmaceutical: Other (comment)apixaban 2.5 mg for 30 days (stop 6/17)             -antiplatelet therapy: ? restart home aspirin 81 mg 3. Pain Management: Currently receiving scheduled Tylenol and Valium BID.  Using Ultram 100 mg approximately once a day and oxycodone 5 mg infrequently (q 4 hours prn). Also has Dilaudid 0.5 mg q 2 hours prn pain not relieved with oral meds (had only one dose yesterday). Robaxin started yesterday. Add vitamin C 1,000mg  daily. Provided list of foods to help with pain.   5/27- pain is more soreness today in vaginal area- con't regimen- will add Kapd prn.  4. Mood: LCSW to evaluate and provide emotional support             -antipsychotic agents: n/a 5. Neuropsych: This patient is capable of making decisions on her own behalf. 6. Skin/Wound Care: Routine skin care checks 7. Fluids/Electrolytes/Nutrition: routine Is and Os  and follow-up chemistries             --avoid dairy due to lactose intolerance 8: Pelvic fracture s/p percutaneous fixation             --WBAT on left lower extremity             --TDWB on right lower extremity             --continue Vit D supplement weekly             --plan follow-up x-rays on 2 weeks per ortho 9: ABLA/small RP hematoma: stable; monitor H and H  -f/u CBC next monday 10: Urinary retention: Foley removed 5/19; monitor for spontaneous void --Flomax started 5/17 0.4 mg daily Resolved, weaning medications 11: Hyperlipidemia: continue Lipitor 12: Hypokalemia: K+ 40 mEq BID; follow-up BMP 13: Hypotension w/ history of HTN --continue home Toprol XL 25 mg q HS             --d/c HCTZ              --continue losartan 100 mg daily             -increase magnesium gluconate to 500mg  HS  -decrease valium to HS 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 Vitamin D deficiency: Level is 15.32: continue ergocalciferol 50,000U once per week for 7 weeks 16. Fatigue: IM B12 ordered. Increase Modafinil to 200mg  daily. Discussed with patient.  17. Obesity: BMI 31.77: provided list of foods to help with weight loss.  18. UTI: >100,000 Klebsiella, start Bactrim, sensitive to Bactrim, discussed with patient.  19. Confusion: wean urecholine  to 5mg  daily since voiding better  5/27- confusion much better 20. Constipation?  5/27- KUB shows no moderate/large stool burden- will wait on adding meds.    LOS: 8 days A FACE TO FACE EVALUATION WAS PERFORMED  Zoe Arnold 06/24/2021, 1:30 PM

## 2021-06-25 MED ORDER — OXYCODONE HCL 5 MG PO TABS
5.0000 mg | ORAL_TABLET | ORAL | Status: DC | PRN
  Administered 2021-06-26 – 2021-07-01 (×6): 10 mg via ORAL
  Filled 2021-06-25 (×6): qty 2

## 2021-06-25 NOTE — Progress Notes (Signed)
Called portable equipment, no kpads at this time.

## 2021-06-25 NOTE — Progress Notes (Signed)
PROGRESS NOTE   Subjective/Complaints:  Pt reports just woke up- hurting really bad, in spite of last pain meds at 6;30 am Hurting so bad, that cannot get comfortabel no matter what position.   Tiny BM yesterday.   Attempted to order Kpad, but none available will increase Oxy to 5-10 mg q4 hours prn since  kpad not available.   ROS:   Pt denies SOB, abd pain, CP, N/V/C/D, and vision changes   Objective:   DG Abd 1 View  Result Date: 06/24/2021 CLINICAL DATA:  Constipation EXAM: ABDOMEN - 1 VIEW COMPARISON:  06/12/2021 FINDINGS: Nonobstructive pattern of bowel gas. No large burden of stool. 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 Screw fixation of acute appearing fractures of the bilateral sacroiliac joints and right pubic rami. IMPRESSION: Nonobstructive pattern of bowel gas. No large burden of stool. Electronically Signed   By: Jearld Lesch M.D.   On: 06/24/2021 11:30   No results for input(s): WBC, HGB, HCT, PLT in the last 72 hours. No results for input(s): NA, K, CL, CO2, GLUCOSE, BUN, CREATININE, CALCIUM in the last 72 hours. No intake or output data in the 24 hours ending 06/25/21 1016       Physical Exam: Vital Signs Blood pressure (!) 114/57, pulse 84, temperature 98 F (36.7 C), resp. rate 16, height 5\' 2"  (1.575 m), weight 78.2 kg, SpO2 97 %.     General: awake, alert, appropriate, wincing in pain with any tiny movement; laying on side; holding self very stiffly.  NAD HENT: conjugate gaze; oropharynx moist CV: regular rate; no JVD Pulmonary: CTA B/L; no W/R/R- good air movement GI: soft, NT, ND, (+)BS- hypoactive Psychiatric: appropriate- but near tears due to pain Neurological: Ox3  Musculoskeletal:        General: No swelling. RLE 3/5, LLE 4/5.  Skin:    General: Skin is warm and dry. +MASD to sacrum. Incisions c/d/i Neurological:     General: No focal deficit present.     Mental Status: She is  alert and oriented to person, place, and time.  Slow at taking medications in the morning Psychiatric:        Mood and Affect: Mood normal.        Behavior: Behavior normal.     Assessment/Plan: 1. Functional deficits which require 3+ hours per day of interdisciplinary therapy in a comprehensive inpatient rehab setting. Physiatrist is providing close team supervision and 24 hour management of active medical problems listed below. Physiatrist and rehab team continue to assess barriers to discharge/monitor patient progress toward functional and medical goals  Care Tool:  Bathing  Bathing activity did not occur: Safety/medical concerns Body parts bathed by patient: Face   Body parts bathed by helper: Buttocks     Bathing assist Assist Level: Set up assist     Upper Body Dressing/Undressing Upper body dressing Upper body dressing/undressing activity did not occur (including orthotics): Safety/medical concerns What is the patient wearing?: Dress    Upper body assist Assist Level: Minimal Assistance - Patient > 75%    Lower Body Dressing/Undressing Lower body dressing      What is the patient wearing?: Incontinence brief  Lower body assist Assist for lower body dressing: Maximal Assistance - Patient 25 - 49% (stance)     Toileting Toileting Toileting Activity did not occur (Clothing management and hygiene only): N/A (no void or bm)  Toileting assist Assist for toileting: Maximal Assistance - Patient 25 - 49%     Transfers Chair/bed transfer  Transfers assist  Chair/bed transfer activity did not occur: Safety/medical concerns (pain, weakness/deconditioning, lethargic)  Chair/bed transfer assist level: 2 Helpers     Locomotion Ambulation   Ambulation assist   Ambulation activity did not occur: Safety/medical concerns (pain, weakness/deconditioning, lethargic)          Walk 10 feet activity   Assist  Walk 10 feet activity did not occur: Safety/medical  concerns (pain, weakness/deconditioning, lethargic)        Walk 50 feet activity   Assist Walk 50 feet with 2 turns activity did not occur: Safety/medical concerns (pain, weakness/deconditioning, lethargic)         Walk 150 feet activity   Assist Walk 150 feet activity did not occur: Safety/medical concerns (pain, weakness/deconditioning, lethargic)         Walk 10 feet on uneven surface  activity   Assist Walk 10 feet on uneven surfaces activity did not occur: Safety/medical concerns (pain, weakness/deconditioning, lethargic)         Wheelchair     Assist Is the patient using a wheelchair?: Yes Type of Wheelchair: Manual Wheelchair activity did not occur: Safety/medical concerns (pain, weakness/deconditioning, lethargic)  Wheelchair assist level: Supervision/Verbal cueing Max wheelchair distance: >176ft    Wheelchair 50 feet with 2 turns activity    Assist    Wheelchair 50 feet with 2 turns activity did not occur: Safety/medical concerns (pain, weakness/deconditioning, lethargic)   Assist Level: Supervision/Verbal cueing   Wheelchair 150 feet activity     Assist  Wheelchair 150 feet activity did not occur: Safety/medical concerns (pain, weakness/deconditioning, lethargic)   Assist Level: Supervision/Verbal cueing   Blood pressure (!) 114/57, pulse 84, temperature 98 F (36.7 C), resp. rate 16, height 5\' 2"  (1.575 m), weight 78.2 kg, SpO2 97 %.   Medical Problem List and Plan: 1. Functional deficits secondary to pelvic fracture             -patient may shower             -ELOS/Goals: 20-22 days S/MinA             -Continue CIR  -Therapy has been complicated by pain/fatigue, but this is improving.   Scon't CIR- PT and OT- pain more limiting today 2.  Antithrombotics: -DVT/anticoagulation:  Pharmaceutical: Other (comment)apixaban 2.5 mg for 30 days (stop 6/17)             -antiplatelet therapy: ? restart home aspirin 81 mg 3. Pain  Management: Currently receiving scheduled Tylenol and Valium BID. Using Ultram 100 mg approximately once a day and oxycodone 5 mg infrequently (q 4 hours prn). Also has Dilaudid 0.5 mg q 2 hours prn pain not relieved with oral meds (had only one dose yesterday). Robaxin started yesterday. Add vitamin C 1,000mg  daily. Provided list of foods to help with pain.   5/27- pain is more soreness today in vaginal area- con't regimen- will add Kapd prn.   5/28- Kpad not available- will increase Oxy to 5-10 mg q4 hours prn for now. So won't use IV Dilaudid as much.  4. Mood: LCSW to evaluate and provide emotional support             -  antipsychotic agents: n/a 5. Neuropsych: This patient is capable of making decisions on her own behalf. 6. Skin/Wound Care: Routine skin care checks 7. Fluids/Electrolytes/Nutrition: routine Is and Os and follow-up chemistries             --avoid dairy due to lactose intolerance 8: Pelvic fracture s/p percutaneous fixation             --WBAT on left lower extremity             --TDWB on right lower extremity             --continue Vit D supplement weekly             --plan follow-up x-rays on 2 weeks per ortho 9: ABLA/small RP hematoma: stable; monitor H and H  -f/u CBC next monday 10: Urinary retention: Foley removed 5/19; monitor for spontaneous void --Flomax started 5/17 0.4 mg daily Resolved, weaning medications 11: Hyperlipidemia: continue Lipitor 12: Hypokalemia: K+ 40 mEq BID; follow-up BMP 13: Hypotension w/ history of HTN --continue home Toprol XL 25 mg q HS             --d/c HCTZ              --continue losartan 100 mg daily             -increase magnesium gluconate to 500mg  HS  -decrease valium to HS 14: Lactose intolerant: avoid dairy; Lactaid tabs TID 15. Vitamin D deficiency: Level is 15.32: continue ergocalciferol 50,000U once per week for 7 weeks 16. Fatigue: IM B12 ordered. Increase Modafinil to 200mg  daily. Discussed with patient.  17. Obesity: BMI  31.77: provided list of foods to help with weight loss.  18. UTI: >100,000 Klebsiella, start Bactrim, sensitive to Bactrim, discussed with patient.  19. Confusion: wean urecholine to 5mg  daily since voiding better  5/27- confusion much better 20. Constipation?  5/27- KUB shows no moderate/large stool burden- will wait on adding meds.   5/28- small BM last night- will monitor   I spent a total of 35   minutes on total care today- >50% coordination of care- due to d/w nursing x2 about Kpad and d/w pt about how to get pain better managed.     LOS: 9 days A FACE TO FACE EVALUATION WAS PERFORMED  Maryan Sivak 06/25/2021, 10:16 AM

## 2021-06-25 NOTE — Progress Notes (Signed)
Physical Therapy Session Note  Patient Details  Name: Zoe Arnold MRN: [id number] Date of Birth: 1945/08/18  Today's Date: 06/25/2021 PT Individual Time: 1300-1342 PT Individual Time Calculation (min): 42 min  Missed time: 18 minutes  Short Term Goals: Week 1:  PT Short Term Goal 1 (Week 1): pt will roll L/R with mod A of 1 PT Short Term Goal 2 (Week 1): pt will transfer sit<>stand with LRAD and max A of 1 PT Short Term Goal 3 (Week 1): pt will transfer bed<>chair with LRAD and max A of 1  Skilled Therapeutic Interventions/Progress Updates:  Pt lying in bed on arrival with Kpad to lower back/pelvis area. Reporting pain at 9/10 currently, RN notified and med's muscle relaxer/tylenol given during session. With encouragement pt agreed to some PT today.   BED MOBILITY: Pt noted to have slid down in bed. HOB lowered with bed placed in boost position. Pt able to use bed rails to pull self up toward [facility] with min assist. Then rolling right/left with rails/mod assist to reposition Kpad in correct place pt wanted to be. Pt yelling out with rolling onto right side due to pain.   TRANSFERS: Pt deferred any EOB or OOB activities this session due to high pain levels. Agreed to do more at next session.  STRENGTHENING: Bil LEs: performed the following ex's for 2 sets of 10 each active to min active assist- ankle pumps with manually resisted PF, quad sets with 5 sec holds, heel slides, straight leg raises, hip abd/add in pain free ranges and short arc quads.   Pt left in bed with needs in reach and bed alarm on. Pain after session 8-9/10.     Therapy Documentation Precautions:  Precautions Precautions: Fall Precaution Comments: Weightbearing restrictions Restrictions Weight Bearing Restrictions: Yes RUE Weight Bearing: Non weight bearing LUE Weight Bearing: Non weight bearing RLE Weight Bearing: Touchdown weight bearing LLE Weight Bearing: Weight bearing as tolerated     Therapy/Group:  Individual Therapy  Sallyanne Kuster, PTA, [facility] 06/25/21, 3:03 PM

## 2021-06-26 ENCOUNTER — Inpatient Hospital Stay (HOSPITAL_COMMUNITY): Payer: Medicare Other

## 2021-06-26 DIAGNOSIS — K5901 Slow transit constipation: Secondary | ICD-10-CM

## 2021-06-26 LAB — CBC
HCT: 31 % — ABNORMAL LOW (ref 36.0–46.0)
Hemoglobin: 9.6 g/dL — ABNORMAL LOW (ref 12.0–15.0)
MCH: 30.3 pg (ref 26.0–34.0)
MCHC: 31 g/dL (ref 30.0–36.0)
MCV: 97.8 fL (ref 80.0–100.0)
Platelets: 461 10*3/uL — ABNORMAL HIGH (ref 150–400)
RBC: 3.17 MIL/uL — ABNORMAL LOW (ref 3.87–5.11)
RDW: 14.1 % (ref 11.5–15.5)
WBC: 5.1 10*3/uL (ref 4.0–10.5)
nRBC: 0 % (ref 0.0–0.2)

## 2021-06-26 LAB — BASIC METABOLIC PANEL
Anion gap: 5 (ref 5–15)
BUN: 20 mg/dL (ref 8–23)
CO2: 27 mmol/L (ref 22–32)
Calcium: 9.3 mg/dL (ref 8.9–10.3)
Chloride: 106 mmol/L (ref 98–111)
Creatinine, Ser: 0.91 mg/dL (ref 0.44–1.00)
GFR, Estimated: >60 mL/min (ref >60)
Glucose, Bld: 98 mg/dL (ref 70–99)
Potassium: 4.5 mmol/L (ref 3.5–5.1)
Sodium: 138 mmol/L (ref 135–145)

## 2021-06-26 MED ORDER — SENNOSIDES-DOCUSATE SODIUM 8.6-50 MG PO TABS
2.0000 | ORAL_TABLET | Freq: Every day | ORAL | Status: DC
  Administered 2021-06-26 – 2021-06-30 (×5): 2 via ORAL
  Filled 2021-06-26 (×5): qty 2

## 2021-06-26 MED ORDER — SORBITOL 70 % SOLN
60.0000 mL | Status: AC
Start: 2021-06-26 — End: 2021-06-26
  Administered 2021-06-26: 60 mL via ORAL
  Filled 2021-06-26: qty 60

## 2021-06-26 MED ORDER — LOSARTAN POTASSIUM 50 MG PO TABS
50.0000 mg | ORAL_TABLET | Freq: Every day | ORAL | Status: DC
  Administered 2021-06-26 – 2021-06-30 (×5): 50 mg via ORAL
  Filled 2021-06-26 (×5): qty 1

## 2021-06-26 NOTE — Progress Notes (Signed)
Occupational Therapy Session Note  Patient Details  Name: Zoe Arnold MRN: [id number] Date of Birth: 10-12-1945  Today's Date: 06/26/2021 OT Individual Time: 1345-1430 OT Individual Time Calculation (min): 45 min    Short Term Goals: Week 2:  OT Short Term Goal 1 (Week 2): STG = LTG 2/2 ELOS  Skilled Therapeutic Interventions/Progress Updates:     S: "I have no energy. I didn't sleep well last night."   O: Functional standing activity completed to increase standing balance while providing pressure relief from sitting in wheelchair. VC provided for proper form and technique when completing sit to stand and stand to sit. Patient completed two trials each. Each standing trial: 2'. Completed seated card sorting activity at table with focus on core stability in order to maintain an upright seated posture versus semi reclined back in chair.     A: Pt continues to demonstrate and verbalize decreased energy and activity tolerance. Due to verbalization of discomfort while seated in w/c, session focused on standing activity tolerance while providing pressure relief to buttocks. Patient collapsed onto elbows when experiencing increased fatigue. Max cueing provided to improve standing posture. When seated, discussed the reason to refrain from a slumped over posture and how it affects the ability to breathe which in turn decreases energy and activity tolerance. Pt verbalized understanding.     P: Continue to work on standing activity tolerance. Improve seated and standing posture with increase core strength and stability  Therapy Documentation Precautions:  Precautions Precautions: Fall Precaution Comments: Weightbearing restrictions Restrictions Weight Bearing Restrictions: Yes  RLE Weight Bearing: Touchdown weight bearing LLE Weight Bearing: Weight bearing as tolerated Pain:  No number provided. Patient verbalized discomfort with buttocks and back while seated in wheelchair. Provided  repositioning while placing two pillows side by side behind back to prevent metal from pushing into back. Stood during therapy session to provide pressure relief to buttocks from sitting in wheelchair.   Therapy/Group: Individual Therapy  Ailene Ravel, OTR/L,CBIS  Supplemental OT - [facility]  06/26/2021, 3:07 PM

## 2021-06-26 NOTE — Progress Notes (Signed)
Recreational Therapy Session Note  Patient Details  Name: TAYLEY MUDRICK MRN: [id number] Date of Birth: [birth year] Today's Date: 06/26/2021  Pain: c/o pelvic pain, nurse aware and medicated Skilled Therapeutic Interventions/Progress Updates: Session was a follow up to last week's stress management/coping session per group requests.  Group members requested review of previously discussed stress exploration topics.  Reviewed and listed factors that contribute to stress and factors that protect against stress..  Emphasis this week on providing more in depth instructions/information on deep breathing, imagery, challenging irrational thoughts and gratitude.  Hand out provided.  Pt actively participated throughout group session sharing thoughts, feelings, experiences.  Pt emotional/tearful intermittently during session but appreciative of this group and its members.   Wiktoria Hemrick 06/26/2021, 4:23 PM

## 2021-06-26 NOTE — Progress Notes (Signed)
Occupational Therapy Session Note  Patient Details  Name: Zoe Arnold MRN: [id number] Date of Birth: 07-05-1945  Today's Date: 06/26/2021 OT Individual Time: 1100-1200 OT Individual Time Calculation (min): 60 min    Short Term Goals: Week 2:  OT Short Term Goal 1 (Week 2): STG = LTG 2/2 ELOS  Skilled Therapeutic Interventions/Progress Updates:    S: "I don't have any pain when I'm not moving."   O: Functional transfer: Pt completed stand pivot transfer from bed to wheelchair using RW at Min A and VC for form and technique.  Bed mobility: Pt completed supine to sidelying with Min A. Sidelying to seated on EOB completed with Min A and increased time with max VC for technique.  Pt washed face while seated at sink in w/c with set-up. UB bathing completed seated in w/c with supervision. Moderate prompts to remain on task. UB dressing  with both pull over shirt and [place] completed with Min Assist while seated. Pt then used mouthwash while seated with set-up. Toilet transfer was completed using grab bar and BSC over toilet. Patient requested folded towels on top of toilet seat to provide cushion. Moderate assist to complete transfer due to poor technique and requiring greater physical assist for lift off to stand. Toileting: Completed hygiene standing with set-up although Max assist provided for pants up and down.  LB dressing complete while seated on toilet. Pt reports that she does have a reacher at home. Does not have one in the room currently. Completed with moderate assist. Required total assist placing right foot into pant legs and pull up depends while only needing min assist to place left leg in.     A: Patient verbalized increased fatigue during session with need for mod-max prompts to remain on task and attempt to complete more for herself. During PT session, patient had voiced possible pressure area to right buttocks. OT examined area with no pressure area visible. Pt  demonstrates poor postural control, activity tolerance, core strength which causes her to experience decreased energy and limited tolerance for ADL participation. Patient also verbalizes that she has not been eating because of a lack of appetite. Will send a message to primary therapy team to discuss.     P: Complete LB dressing with reacher and sock aid to increase functional performance.    Therapy Documentation Precautions:  Precautions Precautions: Fall Precaution Comments: Weightbearing restrictions Restrictions Weight Bearing Restrictions: Yes  RLE Weight Bearing: Touchdown weight bearing LLE Weight Bearing: Weight bearing as tolerated General:   Pain: Pain Assessment Pain Scale: 0-10 Pain Score: 0-No pain (When laying supine in bed)   Therapy/Group: Individual Therapy  Limmie Patricia, OTR/L,CBIS  Supplemental OT - [facility] and [facility]  06/26/2021, 2:21 PM
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
## 2021-06-26 NOTE — Progress Notes (Signed)
PROGRESS NOTE   Subjective/Complaints:  Still c/o pelvic and low back pain. Low back feels "raw". I mentioned lidoderm patches and she seemed to like the idea. Apparently she doesn't realize that she's already receiving lidoderm patches!  Has a hard time getting comfortable at night.  ROS: Patient denies fever, rash, sore throat, blurred vision, dizziness, nausea, vomiting, diarrhea, cough, shortness of breath or chest pain,   headache, or mood change.   Objective:   DG Abd 1 View  Result Date: 06/24/2021 CLINICAL DATA:  Constipation EXAM: ABDOMEN - 1 VIEW COMPARISON:  06/12/2021 FINDINGS: Nonobstructive pattern of bowel gas. No large burden of stool. 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 Screw fixation of acute appearing fractures of the bilateral sacroiliac joints and right pubic rami. IMPRESSION: Nonobstructive pattern of bowel gas. No large burden of stool. Electronically Signed   By: Jearld LeschAlex D Bibbey M.D.   On: 06/24/2021 11:30   Recent Labs    06/26/21 0504  WBC 5.1  HGB 9.6*  HCT 31.0*  PLT 461*   Recent Labs    06/26/21 0504  NA 138  K 4.5  CL 106  CO2 27  GLUCOSE 98  BUN 20  CREATININE 0.91  CALCIUM 9.3    Intake/Output Summary (Last 24 hours) at 06/26/2021 1100 Last data filed at 06/26/2021 [id number] Gross per 24 hour  Intake 220 ml  Output --  Net 220 ml         Physical Exam: Vital Signs Blood pressure (!) 107/47, pulse 87, temperature 98.1 F (36.7 C), temperature source Oral, resp. rate 18, height 5\' 2"  (1.575 m), weight 78.2 kg, SpO2 92 %.     Constitutional: No distress . Vital signs reviewed. sleepy HEENT: NCAT, EOMI, oral membranes moist Neck: supple Cardiovascular: RRR without murmur. No JVD    Respiratory/Chest: CTA Bilaterally without wheezes or rales. Normal effort    GI/Abdomen: BS +, non-tender, non-distended Ext: no clubbing, cyanosis, or edema Psych: pleasant but anxious.   Neurological: Ox3  Musculoskeletal:        General: No swelling. RLE 3/5, LLE 4/5.  Has pain with movement of pelvis and  LE's R>L Skin:    General: Skin is warm and dry. +MASD to sacrum. Incisions c/d/i Neurological:     General: No focal deficit present.     Mental Status: She is alert and oriented to person, place, and time.  Slow at taking medications in the morning       Assessment/Plan: 1. Functional deficits which require 3+ hours per day of interdisciplinary therapy in a comprehensive inpatient rehab setting. Physiatrist is providing close team supervision and 24 hour management of active medical problems listed below. Physiatrist and rehab team continue to assess barriers to discharge/monitor patient progress toward functional and medical goals  Care Tool:  Bathing  Bathing activity did not occur: Safety/medical concerns Body parts bathed by patient: Face   Body parts bathed by helper: Buttocks     Bathing assist Assist Level: Set up assist     Upper Body Dressing/Undressing Upper body dressing Upper body dressing/undressing activity did not occur (including orthotics): Safety/medical concerns What is the patient wearing?: Dress    Upper  body assist Assist Level: Minimal Assistance - Patient > 75%    Lower Body Dressing/Undressing Lower body dressing      What is the patient wearing?: Incontinence brief     Lower body assist Assist for lower body dressing: Maximal Assistance - Patient 25 - 49% (stance)     Toileting Toileting Toileting Activity did not occur (Clothing management and hygiene only): N/A (no void or bm)  Toileting assist Assist for toileting: Maximal Assistance - Patient 25 - 49%     Transfers Chair/bed transfer  Transfers assist  Chair/bed transfer activity did not occur: Safety/medical concerns (pain, weakness/deconditioning, lethargic)  Chair/bed transfer assist level: Moderate Assistance - Patient 50 - 74%      Locomotion Ambulation   Ambulation assist   Ambulation activity did not occur: Safety/medical concerns (pain, weakness/deconditioning, lethargic)          Walk 10 feet activity   Assist  Walk 10 feet activity did not occur: Safety/medical concerns (pain, weakness/deconditioning, lethargic)        Walk 50 feet activity   Assist Walk 50 feet with 2 turns activity did not occur: Safety/medical concerns (pain, weakness/deconditioning, lethargic)         Walk 150 feet activity   Assist Walk 150 feet activity did not occur: Safety/medical concerns (pain, weakness/deconditioning, lethargic)         Walk 10 feet on uneven surface  activity   Assist Walk 10 feet on uneven surfaces activity did not occur: Safety/medical concerns (pain, weakness/deconditioning, lethargic)         Wheelchair     Assist Is the patient using a wheelchair?: Yes Type of Wheelchair: Manual Wheelchair activity did not occur: Safety/medical concerns (pain, weakness/deconditioning, lethargic)  Wheelchair assist level: Supervision/Verbal cueing Max wheelchair distance: >134ft    Wheelchair 50 feet with 2 turns activity    Assist    Wheelchair 50 feet with 2 turns activity did not occur: Safety/medical concerns (pain, weakness/deconditioning, lethargic)   Assist Level: Supervision/Verbal cueing   Wheelchair 150 feet activity     Assist  Wheelchair 150 feet activity did not occur: Safety/medical concerns (pain, weakness/deconditioning, lethargic)   Assist Level: Supervision/Verbal cueing   Blood pressure (!) 107/47, pulse 87, temperature 98.1 F (36.7 C), temperature source Oral, resp. rate 18, height 5\' 2"  (1.575 m), weight 78.2 kg, SpO2 92 %.   Medical Problem List and Plan: 1. Functional deficits secondary to pelvic fracture             -patient may shower             -ELOS/Goals: 20-22 days S/MinA              -Therapy has been complicated by pain/fatigue,  but this is improving.   -Continue CIR therapies including PT, OT 2.  Antithrombotics: -DVT/anticoagulation:  Pharmaceutical: Other (comment)apixaban 2.5 mg for 30 days (stop 6/17)             -antiplatelet therapy: ? restart home aspirin 81 mg 3. Pain Management: Currently receiving scheduled Tylenol and Valium BID. Using Ultram 100 mg approximately once a day and oxycodone 5 mg infrequently (q 4 hours prn). Also has Dilaudid 0.5 mg q 2 hours prn pain not relieved with oral meds (had only one dose yesterday). Robaxin started yesterday. Add vitamin C 1,000mg  daily. Provided list of foods to help with pain.   5/27- pain is more soreness today in vaginal area- con't regimen- will add Kapd prn.   5/28- Kpad  not available- will increase Oxy to 5-10 mg q4 hours prn for now. So won't use IV Dilaudid as much.   5/29 using kpad this morning. She feels that she has "enough" medications, and i'm frankly hesitant to increase anything else at this point   -encouraged use of lidocaine patches 4. Mood: LCSW to evaluate and provide emotional support             -antipsychotic agents: n/a 5. Neuropsych: This patient is capable of making decisions on her own behalf. 6. Skin/Wound Care: Routine skin care checks 7. Fluids/Electrolytes/Nutrition: routine Is and Os and follow-up chemistries             --avoid dairy due to lactose intolerance 8: Pelvic fracture s/p percutaneous fixation             --WBAT on left lower extremity             --TDWB on right lower extremity             --continue Vit D supplement weekly             --plan follow-up x-rays on 2 weeks per ortho 9: ABLA/small RP hematoma: stable; monitor H and H  -5/29 hgb 9.6 10: Urinary retention: Foley removed 5/19; monitor for spontaneous void --Flomax started 5/17 0.4 mg daily Resolved, weaning medications 11: Hyperlipidemia: continue Lipitor 12: Hypokalemia: K+ 40 mEq BID; follow-up BMP 13: Hypotension w/ history of HTN --continue home  Toprol XL 25 mg q HS             --d/c HCTZ              --continue losartan 100 mg daily             -increase magnesium gluconate to 500mg  HS  -decreased valium to HS  -5/29 still soft---reduce losartan to 50mg  14: Lactose intolerant: avoid dairy; Lactaid tabs TID 15. Vitamin D deficiency: Level is 15.32: continue ergocalciferol 50,000U once per week for 7 weeks 16. Fatigue: IM B12 ordered. Increase Modafinil to 200mg  daily. Discussed with patient.  17. Obesity: BMI 31.77: provided list of foods to help with weight loss.  18. UTI: >100,000 Klebsiella, start Bactrim, sensitive to Bactrim, discussed with patient.  19. Confusion: wean urecholine to 5mg  daily since voiding better  5/27- confusion much better 20. Constipation?  5/27- KUB shows no moderate/large stool burden- will wait on adding meds.   5/29 only a smear on 5/27   -try sorbitol today   -dulcolax suppository   -add senna-s at bedtime        LOS: 10 days A FACE TO FACE EVALUATION WAS PERFORMED  6/27 06/26/2021, 11:00 AM

## 2021-06-26 NOTE — Progress Notes (Signed)
Physical Therapy Weekly Progress Note  Patient Details  Name: Zoe Arnold MRN: [id number] Date of Birth: 20-Dec-1945  Beginning of progress report period: Jun 17, 2021 End of progress report period: Jun 26, 2021  Today's Date: 06/26/2021 PT Individual Time: 0916-0956 PT Individual Time Calculation (min): 40 min   Patient has met 3 of 3 short term goals. Pt demonstrates slow progress towards long term goals. Pt is currently able to transfer supine<>sitting EOB with min A using bed features but requires max A for sit<>supine. Pt is able to perform transfers with RW and mod A but has refused gait training or practicing with stair navigation. Have discussed recommendation for ramp upon D/C, however pt is "not concerned" about going home and dismissive of therapist's recommendations and education, stating she will "figure it out when she gets home". Pt continues to be limited by pain in R hip/pelvis, low BP, poor adherence to RLE TDWB precautions, generalized weakness/deconditioning, and decreased motivation. Have notified CSW that pt will require family education prior to discharge home.   Patient continues to demonstrate the following deficits muscle weakness, decreased cardiorespiratoy endurance, and decreased standing balance, decreased postural control, decreased balance strategies, and difficulty maintaining precautions and therefore will continue to benefit from skilled PT intervention to increase functional independence with mobility.  Patient progressing toward long term goals..  Continue plan of care.  PT Short Term Goals Week 1:  PT Short Term Goal 1 (Week 1): pt will roll L/R with mod A of 1 PT Short Term Goal 1 - Progress (Week 1): Met PT Short Term Goal 2 (Week 1): pt will transfer sit<>stand with LRAD and max A of 1 PT Short Term Goal 2 - Progress (Week 1): Met PT Short Term Goal 3 (Week 1): pt will transfer bed<>chair with LRAD and max A of 1 PT Short Term Goal 3 - Progress (Week  1): Met Week 2:  PT Short Term Goal 1 (Week 2): STG =LTG due to LOS  Skilled Therapeutic Interventions/Progress Updates:  Ambulation/gait training;Discharge planning;Functional mobility training;Psychosocial support;Therapeutic Activities;Visual/perceptual remediation/compensation;Balance/vestibular training;Disease management/prevention;Neuromuscular re-education;Skin care/wound management;Therapeutic Exercise;Wheelchair propulsion/positioning;Cognitive remediation/compensation;DME/adaptive equipment instruction;Pain management;Splinting/orthotics;UE/LE Strength taining/ROM;Community reintegration;Patient/family education;Stair training;UE/LE Coordination activities   Today's Interventions: Received pt semi-reclined in bed reporting increased pain this morning and fatigue from not sleeping well. Pt agreeable to PT treatment and reported pain 6-7/10 in R hip/pelvis (premedicated). Session with emphasis on functional mobility/transfers, dressing, dynamic standing balance, and BP management. Donned ted hose dependently and pt transferred semi-reclined<>sitting EOB with HOB elevated and heavy reliance on bedrails with min A for LLE management and significantly increased time. Donned pants sitting EOB with max A (as pt declined to attempt herself). Pt transferred sit<>stand<>pivot with RW and mod A, however pt unable to stand upright and extend LUE, flexing over RW and demonstrating poor adherence to RLE TDWB precautions. Sat in WC at sink and combed hair and brushed teeth with set up assist, then reported feeling dizzy. BP: 107/47. Stood with RW and mod A again to get standing BP: 108/60 HR 97bpm - pt denied any dizziness but reported feeling "weak". Educated pt on importance of participating in [facility] therapy for global strengthening and to regulate BP (since pt refused to get OOB with therapy over the weekend), however pt unreceptive to education. Also educated pt on importance of repositioning and pressure  relief as pt c/o "sore spot" on R hip/buttocks but unable to distinguish between pain from pressure sore or pain from fracture - notified OT to assess  in next session due to time restrictions. Concluded session with pt sitting in WC, needs within reach, and seatbelt alarm on.   Therapy Documentation Precautions:  Precautions Precautions: Fall Precaution Comments: Weightbearing restrictions Restrictions Weight Bearing Restrictions: Yes RLE Weight Bearing: Touchdown weight bearing LLE Weight Bearing: Weight bearing as tolerated  Therapy/Group: Individual Therapy Alfonse Alpers PT, DPT  06/26/2021, 6:57 AM

## 2021-06-27 MED ORDER — MODAFINIL 100 MG PO TABS
100.0000 mg | ORAL_TABLET | Freq: Every day | ORAL | Status: DC
  Administered 2021-06-28: 100 mg via ORAL
  Filled 2021-06-27: qty 1

## 2021-06-27 MED ORDER — MUSCLE RUB 10-15 % EX CREA
TOPICAL_CREAM | CUTANEOUS | Status: DC | PRN
  Filled 2021-06-27: qty 85

## 2021-06-27 NOTE — Progress Notes (Signed)
Occupational Therapy Session Note  Patient Details  Name: Zoe Arnold MRN: [id number] Date of Birth: [birth year]  Today's Date: 06/27/2021 OT Individual Time: 1101-1200 OT Individual Time Calculation (min): 59 min  OT missed time: 30 min Missed time reason: fatigue   Short Term Goals: Week 2:  OT Short Term Goal 1 (Week 2): STG = LTG 2/2 ELOS  Skilled Therapeutic Interventions/Progress Updates:  Session 1 Skilled OT intervention completed with focus on cognitive strategies including sequencing/problem solving, meal prep planning, unsupported sitting tolerance. Pt received in recliner, groggy, requiring increased time to alert, with pt c/o generalized pain (un-rated) and fatigue from lack of sleep with pt pre-medicated and care team aware of pt's status with lack of nutrition, fluid intake and decreased energy level. BP assessed with pt reclined in recliner, 114/58, pt asymptomatic. Retrieved eye drops per pt request with c/o dry eyes. Pt declining out of room therapy, however agreeable to in room task in recliner as pt has just received some comfort in her current position. Agreeable to sit upright in recliner without back support to promote core control and endurance needed for ADL tasks. Set up with color sliding board, pt required unreasonable amount of time to copy design provided onto board, with difficulty understanding having to move peg pieces in different directions prior to sliding the needed color to the correct slot, however was able to accurately complete with supervision A when given quiet time and concentration for self-problem solving. Cues required for sitting upright as pt attempting to lean heavily to L side on elbow vs functional postural control. Able to place/remove clips on BUE to promote anterior weight shifting needed for posterior pericare. Required increased time to gather meal prep items on cards, however able to gather all items without guidance or assist however pt  missing a few, with report "I just don't feel into this today." Pt with lack of participation, therefore discontinued task. Repositioned pt with min A for comfort in recliner, donned alarm belt, with pt left semi-reclined in recliner for lunch with all immediate needs met at end of session.  Session 2 Pt received in recliner, asleep, easily woken however lethargic, reporting feeling "groggy." Offered to assist pt back to bed or bathroom however pt declined as well as declining in room exercises or activities, with pt requesting to "just rest to get better today." Pt was left with all needs in reach, in recliner with belt alarm on at therapist departure.   Therapy Documentation Precautions:  Precautions Precautions: Fall Precaution Comments: Weightbearing restrictions Restrictions Weight Bearing Restrictions: Yes RLE Weight Bearing: Touchdown weight bearing LLE Weight Bearing: Weight bearing as tolerated   Therapy/Group: Individual Therapy  Blase Mess, MS, OTR/L 06/27/2021, 7:34 AM

## 2021-06-27 NOTE — Progress Notes (Signed)
PROGRESS NOTE   Subjective/Complaints: Feels muscle soreness all over and was unable to sleep last night. Will decrease Modafinil to 100mg  in case this is impacting her sleep at night  ROS: Patient denies fever, rash, sore throat, blurred vision, dizziness, nausea, vomiting, diarrhea, cough, shortness of breath or chest pain,   headache, or mood change. +insomnia  Objective:   DG Pelvis Comp Min 3V  Result Date: 06/26/2021 CLINICAL DATA:  Pelvic ORIF EXAM: JUDET PELVIS - 3+ VIEW COMPARISON:  06/24/2021 FINDINGS: Postsurgical changes from right pubic ORIF and bilateral SI joint fixation. Right-sided pelvic fractures again seen without change in alignment. No areas of bridging bone formation are evident. Similar degree of pubic diastasis. Calcified uterine fibroid. IMPRESSION: Unchanged appearance of right pelvic fractures, status post ORIF. Electronically Signed   By: Duanne GuessNicholas  Plundo D.O.   On: 06/26/2021 12:45   Recent Labs    06/26/21 0504  WBC 5.1  HGB 9.6*  HCT 31.0*  PLT 461*   Recent Labs    06/26/21 0504  NA 138  K 4.5  CL 106  CO2 27  GLUCOSE 98  BUN 20  CREATININE 0.91  CALCIUM 9.3    Intake/Output Summary (Last 24 hours) at 06/27/2021 1055 Last data filed at 06/27/2021 0900 Gross per 24 hour  Intake 240 ml  Output --  Net 240 ml         Physical Exam: Vital Signs Blood pressure (!) 149/70, pulse 93, temperature 98.7 F (37.1 C), resp. rate 16, height 5\' 2"  (1.575 m), weight 78.2 kg, SpO2 95 %.  Constitutional: No distress . Vital signs reviewed. Sleepy, BMI 31.53 HEENT: NCAT, EOMI, oral membranes moist Neck: supple Cardiovascular: RRR without murmur. No JVD    Respiratory/Chest: CTA Bilaterally without wheezes or rales. Normal effort    GI/Abdomen: BS +, non-tender, non-distended Ext: no clubbing, cyanosis, or edema Psych: pleasant but anxious.  Neurological: Ox3  Musculoskeletal:         General: No swelling. RLE 3/5, LLE 4/5.  Has pain with movement of pelvis and  LE's R>L Skin:    General: Skin is warm and dry. +MASD to sacrum. Incisions c/d/i Neurological:     General: No focal deficit present.     Mental Status: She is alert and oriented to person, place, and time.  Slow at taking medications in the morning       Assessment/Plan: 1. Functional deficits which require 3+ hours per day of interdisciplinary therapy in a comprehensive inpatient rehab setting. Physiatrist is providing close team supervision and 24 hour management of active medical problems listed below. Physiatrist and rehab team continue to assess barriers to discharge/monitor patient progress toward functional and medical goals  Care Tool:  Bathing  Bathing activity did not occur: Safety/medical concerns Body parts bathed by patient: Face   Body parts bathed by helper: Buttocks     Bathing assist Assist Level: Set up assist     Upper Body Dressing/Undressing Upper body dressing Upper body dressing/undressing activity did not occur (including orthotics): Safety/medical concerns What is the patient wearing?: Dress    Upper body assist Assist Level: Minimal Assistance - Patient > 75%  Lower Body Dressing/Undressing Lower body dressing      What is the patient wearing?: Incontinence brief     Lower body assist Assist for lower body dressing: Maximal Assistance - Patient 25 - 49% (stance)     Toileting Toileting Toileting Activity did not occur (Clothing management and hygiene only): N/A (no void or bm)  Toileting assist Assist for toileting: Maximal Assistance - Patient 25 - 49%     Transfers Chair/bed transfer  Transfers assist  Chair/bed transfer activity did not occur: Safety/medical concerns (pain, weakness/deconditioning, lethargic)  Chair/bed transfer assist level: Minimal Assistance - Patient > 75%     Locomotion Ambulation   Ambulation assist   Ambulation activity  did not occur: Safety/medical concerns (pain, weakness/deconditioning, lethargic)          Walk 10 feet activity   Assist  Walk 10 feet activity did not occur: Safety/medical concerns (pain, weakness/deconditioning, lethargic)        Walk 50 feet activity   Assist Walk 50 feet with 2 turns activity did not occur: Safety/medical concerns (pain, weakness/deconditioning, lethargic)         Walk 150 feet activity   Assist Walk 150 feet activity did not occur: Safety/medical concerns (pain, weakness/deconditioning, lethargic)         Walk 10 feet on uneven surface  activity   Assist Walk 10 feet on uneven surfaces activity did not occur: Safety/medical concerns (pain, weakness/deconditioning, lethargic)         Wheelchair     Assist Is the patient using a wheelchair?: Yes Type of Wheelchair: Manual Wheelchair activity did not occur: Safety/medical concerns (pain, weakness/deconditioning, lethargic)  Wheelchair assist level: Supervision/Verbal cueing Max wheelchair distance: >165ft    Wheelchair 50 feet with 2 turns activity    Assist    Wheelchair 50 feet with 2 turns activity did not occur: Safety/medical concerns (pain, weakness/deconditioning, lethargic)   Assist Level: Supervision/Verbal cueing   Wheelchair 150 feet activity     Assist  Wheelchair 150 feet activity did not occur: Safety/medical concerns (pain, weakness/deconditioning, lethargic)   Assist Level: Supervision/Verbal cueing   Blood pressure (!) 149/70, pulse 93, temperature 98.7 F (37.1 C), resp. rate 16, height 5\' 2"  (1.575 m), weight 78.2 kg, SpO2 95 %.   Medical Problem List and Plan: 1. Functional deficits secondary to pelvic fracture             -patient may shower             -ELOS/Goals: 20-22 days S/MinA              -Therapy has been complicated by pain/fatigue, but this is improving.   -Continue CIR therapies including PT, OT 2.   Antithrombotics: -DVT/anticoagulation:  Pharmaceutical: Other (comment)apixaban 2.5 mg for 30 days (stop 6/17)             -antiplatelet therapy: ? restart home aspirin 81 mg 3. Pain from pelvic fracture: Currently receiving scheduled Tylenol and Valium BID. Using Ultram 100 mg approximately once a day and oxycodone 5 mg infrequently (q 4 hours prn). D/c Dilaudid. Robaxin started yesterday. Add vitamin C 1,000mg  daily. Provided list of foods to help with pain.   5/27- pain is more soreness today in vaginal area- con't regimen- will add Kapd prn.   5/28- Kpad not available- will increase Oxy to 5-10 mg q4 hours prn for now. So won't use IV Dilaudid as much.   5/29 using kpad this morning. She feels that she has "enough"  medications, and i'm frankly hesitant to increase anything else at this point   -encouraged use of lidocaine patches 4. Mood: LCSW to evaluate and provide emotional support             -antipsychotic agents: n/a 5. Neuropsych: This patient is capable of making decisions on her own behalf. 6. Skin/Wound Care: Routine skin care checks 7. Fluids/Electrolytes/Nutrition: routine Is and Os and follow-up chemistries             --avoid dairy due to lactose intolerance 8: Pelvic fracture s/p percutaneous fixation             --WBAT on left lower extremity             --TDWB on right lower extremity             --continue Vit D supplement weekly             --plan follow-up x-rays on 2 weeks per ortho 9: ABLA/small RP hematoma: stable; monitor H and H  -5/29 hgb 9.6 10: Urinary retention: Foley removed 5/19; monitor for spontaneous void --Flomax started 5/17 0.4 mg daily Resolved, weaning medications 11: Hyperlipidemia: continue Lipitor 12: Hypokalemia: K+ 40 mEq BID; follow-up BMP 13: Hypotension w/ history of HTN --continue home Toprol XL 25 mg q HS             --d/c HCTZ              --continue losartan 100 mg daily             -increase magnesium gluconate to 500mg   HS  -decreased valium to HS  -5/29 still soft---reduce losartan to 50mg  14: Lactose intolerant: avoid dairy; Lactaid tabs TID 15. Vitamin D deficiency: Level is 15.32: continue ergocalciferol 50,000U once per week for 7 weeks 16. Fatigue: IM B12 ordered. Idecrease Modafinil to 100mg  in case impacting sleep at night.  17. Obesity: BMI 31.77: provided list of foods to help with weight loss.  18. UTI: >100,000 Klebsiella, start Bactrim, sensitive to Bactrim, discussed with patient.  19. Confusion: wean urecholine to 5mg  daily since voiding better  5/27- confusion much better 20. Constipation?  5/27- KUB shows no moderate/large stool burden- will wait on adding meds.   5/29 only a smear on 5/27   -try sorbitol today   -dulcolax suppository   -add senna-s at bedtime 21. Insomnia: decrease Modafinil to 100mg  daily 22. Muscle soreness: Bengay ordered.         LOS: 11 days A FACE TO FACE EVALUATION WAS PERFORMED  6/27 Constant Mand[place] 06/27/2021, 10:55 AM

## 2021-06-27 NOTE — Progress Notes (Signed)
Physical Therapy Session Note  Patient Details  Name: Zoe Arnold MRN: [id number] Date of Birth: 08-17-1945  Today's Date: 06/27/2021 PT Individual Time: [id number] and 1302-1356 PT Individual Time Calculation (min): 55 min and 54 min  Short Term Goals: Week 1:  PT Short Term Goal 1 (Week 1): pt will roll L/R with mod A of 1 PT Short Term Goal 1 - Progress (Week 1): Met PT Short Term Goal 2 (Week 1): pt will transfer sit<>stand with LRAD and max A of 1 PT Short Term Goal 2 - Progress (Week 1): Met PT Short Term Goal 3 (Week 1): pt will transfer bed<>chair with LRAD and max A of 1 PT Short Term Goal 3 - Progress (Week 1): Met Week 2:  PT Short Term Goal 1 (Week 2): STG =LTG due to LOS  Skilled Therapeutic Interventions/Progress Updates:   Treatment Session 1 Received pt semi-reclined in bed reporting "this is the worst day I've had since I've been here" and reported not being able to move or roll in bed, reporting pain 11/10 in R hip/pelvis. Pt reported RN just gave pain meds. Pt also reported not sleeping last night and continues to have little/no appetite - notified MD of concerns. With encouragement, pt agreed to transfer OOB for improved positioning/alertness. Pt extremely lethargic throughout session and hardly able to keep eyes open. BP supine: 120/62. Donned ted hose and non-skid socks dependently and pt transferred semi-reclined<>sitting EOB with HOB elevated and use of bedrails with mod HHA. Pt reported increased dizziness sitting EOB - BP: 127/55. Donned pants sitting EOB with max A due to fatigue and pt transferred sit<>stand with RW and heavy min A and required max A to pull pants over hips. Stand<>pivot bed<>recliner with RW and heavy min A - cues for adherence to RLE TDWB precautions but pt did not appear to be adhering (and also refuses to place RLE on therapist's foot). Pt reported exhaustion after transfer stating "I feel like I just worked a 10 hour shift", however, despite  fatigue and pain, pt still extremely particular about set up, requiring increased time to assist with positioning and comfort in recliner (pt insisting on sitting on pillowcase on top of Roho cushion and having multiple pillows around her). With encouragement, pt agreed to eat soup and dink cranberry juice and Glucerna - provided these to pt. Concluded session with pt sitting in recliner, needs within reach, and seatbelt alarm on.   Treatment Session 2 Received pt sitting in recliner and reporting pain 4/10 in R hip/pelvis - RN notified to administer pain medication. Session with emphasis on discharge planning, problem solving, and toileting. NT arrived to check vitals. Discussed D/C plan and pt reports she can only rely on herself at home and that her husband won't be able to provide physical assist. Pt reports she thinks she has a bedside commode in the attic but unsure if her grandson can look for it or not. Offered to personally call grandson and/or granddaughter to discuss equipment but pt declined, stating they were "too busy". Notified CSW of concerns regarding need for family education and concrete discharge plan; CSW reported that pt's husband has not been answering calls. Asked pt to confirm husband's phone number and pt called husband and briefly discussed D/C plan, but failed to specify therapist's recommendations for ramp. Therapist asked to speak with pt's husband to confirm equipment and schedule family education but pt refused to allow therapist to speak with husband. Again reiterated importance of getting ramp  for D/C to maximize independence and promote safety but pt ultimately dismissing recommendation stating she will have "the whole neighborhood" carry her in the house if she has to. Then discussed ordering WC (as pt is not ambulatory) and pt reported she wants to "keep her options open" and is not sure if she wants/needs one - informed pt she has the right to refuse it but it is necessary at  this point. Pt unable to tell therapist details (type, size, style, condition) of her mother's WC and if it can be located for her to use at home. Placed sign in pt's room for family to call CSW when they come to visit due to pt's refusal to comply with therapist's safety recommendations. Pt then reported urge to void and transferred recliner<>bedside commode with RW and mod A (poor adherence to RLE TDWB precautions) and doffed clothing with max A. Concluded session with pt sitting on commode left in care of NT due to time restrictions.   Therapy Documentation Precautions:  Precautions Precautions: Fall Precaution Comments: Weightbearing restrictions Restrictions Weight Bearing Restrictions: Yes RUE Weight Bearing: Non weight bearing LUE Weight Bearing: Non weight bearing RLE Weight Bearing: Touchdown weight bearing LLE Weight Bearing: Weight bearing as tolerated  Therapy/Group: Individual Therapy Anna M Johnson Anna Johnson PT, DPT  06/27/2021, 7:00 AM  

## 2021-06-27 NOTE — Progress Notes (Signed)
Patient ID: Zoe Arnold, female   DOB: [birth year], 76 y.o.   MRN: [id number]  Hospital Bed and Rolling Walker ordered through [employer]. SW waiting on patient WC sizing from PT.

## 2021-06-27 NOTE — Progress Notes (Signed)
Patient ID: Zoe Arnold, female   DOB: October 18, 1945, 76 y.o.   MRN: [id number]  Sw left VM with family in reference to scheduling family education.

## 2021-06-28 MED ORDER — GABAPENTIN 100 MG PO CAPS
100.0000 mg | ORAL_CAPSULE | Freq: Every day | ORAL | Status: DC
  Administered 2021-06-28 – 2021-06-30 (×3): 100 mg via ORAL
  Filled 2021-06-28 (×3): qty 1

## 2021-06-28 NOTE — Progress Notes (Signed)
Patient ID: Zoe Arnold, female   DOB: [birth year], 76 y.o.   MRN: [id number] Team Conference Report to Patient/Family  Team Conference discussion was reviewed with the patient and caregiver, including goals, any changes in plan of care and target discharge date.  Patient and caregiver express understanding and are in agreement.  The patient has a target discharge date of 07/01/21.  Sw left VM with patient son. Sw spoke with patient daughter Zoe Arnold to inform her of conference updates, discharge plans and attempt to schedule family education. Daughter reports she will work with her father and brother to ensure someone attends before discharge, anticipating Friday. Sw will wait for patient daughter to follow up.   Andria Rhein 06/28/2021, 1:33 PM

## 2021-06-28 NOTE — Progress Notes (Signed)
Orthopaedic Trauma Progress Note  Patient sleeping comfortably in bedside chair this afternoon. Continues to make progress with therapies. Repeat pelvic imaging performed on 06/26/21 stable. Plan for discharge on 07/01/21.   ASSESSMENT: Zoe Arnold is a 76 y.o. female s/p OPEN REDUCTION INTERNAL FIXATION PELVIC FRACTURES 06/12/21  PLAN: Weightbearing: TDWB RLE. WBAT LLE ROM: Unrestricted range of motion BLE Incisional and dressing care: Okay to leave open to air Showering: Incisions may get wet Orthopedic device(s): None  Pain management: Continue current regimen VTE prophylaxis: Eliquis Impediments to Fracture Healing: Vitamin D level 15, continue supplementation  Dispo: Continue care per CIR.  Follow - up plan: 2 weeks after d/c for wound check and repeat x-rays   Contact information:  Truitt Merle MD, Thyra Breed PA-C. After hours and holidays please check [email or web address] for group call information for [facility]   Thompson Caul, PA-C [phone] (office) [email or web address]

## 2021-06-28 NOTE — Progress Notes (Signed)
Physical Therapy Session Note  Patient Details  Name: Zoe Arnold MRN: [id number] Date of Birth: 10-13-1945  Today's Date: 06/28/2021 PT Individual Time: 0800-0900; 1300-1400 PT Individual Time Calculation (min): 60 min and 60 min  Short Term Goals: Week 2:  PT Short Term Goal 1 (Week 2): STG =LTG due to LOS  Skilled Therapeutic Interventions/Progress Updates:    Session 1: Pt received seated in bed, reports "12/10" pain at site of pelvic fracture and reports difficulty being able to move this AM. Pt reports being premedicated for pain prior to start of therapy session and is utilizing kpad for pain management. Pt initially agreeable to bed-level LE stretching to "warm-up" prior to getting OOB. Pt then requests to wash her face and brush her teeth, agreeable with encouragement to get up to w/c to go to sink for these tasks. Supine to sit with mod A with increased time, use of bedrail and HOB elevated, assist needed for RLE management and trunk elevation. Pt intially with impaired balance in sitting (swaying noted) and reports feeling dizzy. Symptoms and balance improve with seated rest break. Sit to stand with mod A to RW from elevated bed. Stand pivot transfer bed to w/c with RW and min A with cues for safe transfer technique and adherence to WB precautions. Pt is setup A for oral hygiene and washing her face while seated in w/c at sink. Pt requests to toilet, insistent upon using stedy for transfer despite education that she is able to safely transfer with RW and needs to continue to practice this method for safe d/c home. Pt reports she plans on having her family purchase a stedy for use at home. Sit to stand with min A to stedy, stedy transfer to elevated BSC over toilet. Pt able to continently void while seated on toilet, setup A for pericare with assist needed for clothing management. Pt requests to return to bed following toileting, stedy transfer back to bed. Sit to supine mod A needed for  BLE management. Pt agreeable to try positioning in sidelying due to pain from pressure in her buttocks. Supine to L sidelying with min A for RLE management. Pt left in L sidelying in bed positioned with pillows, needs in reach. Pt missed 15 min of scheduled therapy session due to pain and fatigue.  Session 2: Pt received seated in recliner in room, agreeable to PT session. Pt reports 4/10 pain at site of pelvic fracture, much improved from AM session. Pt also able to receive Tylenol at beginning of therapy session. Sit to stand with min A to RW during session. Stand pivot transfer recliner to/from w/c with RW and min A. Manual w/c propulsion 2 x 100 ft with use of BUE at Supervision level with cues for obstacle avoidance. Lengthy discussion with pt regarding d/c plan, recommended equipment upon d/c home, etc. Pt reports she is waiting to hear from her family what equipment she may already have at home. Pt agreeable to return to recliner at end of session as she wants to avoid going to bed/sleep until closer to bedtime. Pt left seated in recliner in room with needs in reach, quick release belt and chair alarm in place at end of session.  Therapy Documentation Precautions:  Precautions Precautions: Fall Precaution Comments: Weightbearing restrictions Restrictions Weight Bearing Restrictions: Yes RUE Weight Bearing: Non weight bearing LUE Weight Bearing: Non weight bearing RLE Weight Bearing: Touchdown weight bearing LLE Weight Bearing: Weight bearing as tolerated General: PT Amount of Missed Time (min):  15 Minutes PT Missed Treatment Reason: Pain;Patient fatigue      Therapy/Group: Individual Therapy   Peter Congo, PT, DPT, CSRS 06/28/2021, 10:22 AM

## 2021-06-28 NOTE — Progress Notes (Signed)
Occupational Therapy Discharge Summary  Patient Details  Name: Zoe Arnold MRN: [id number] Date of Birth: 01-22-1946  Patient has met 6 of 11 long term goals due to improved activity tolerance, improved balance, and postural control.  Patient to discharge at overall min A level for functional transfers with RW, and mod to max A for self-care tasks. OT and care team have attempted on numerous accounts to provide family education, as well as to reinforce pt's CLOF, assist level and needs, however family and pt have not been receptive with the attempts made. Discussed in length DME recommendations, how to maximize safety and medical well being upon discharge, but limited carryover demonstrated. Patient's husband, and children have all communicated the ability to provide the necessary physical and cognitive assistance at discharge.    Reasons goals not met: Sit > stand, bathing, LB dressing, toileting and toilet transfer goals not met at the CGA to min A level secondary to self-limiting behaviors and lack of carry over with education provided. Currently pt requires min A for sit > stand, Mod A for bathing, Max A for LB dressing, Max A toileting and Min A toilet transfer.  Recommendation:  Patient will benefit from ongoing skilled OT services in home health setting to continue to advance functional skills in the area of BADL and Reduce care partner burden.  Equipment: 3 in 1 BSC  Reasons for discharge: lack of progress toward goals, treatment goals met, and discharge from hospital  Patient/family agrees with progress made and goals achieved: Yes  OT Discharge Precautions/Restrictions  Precautions Precautions: Fall Restrictions Weight Bearing Restrictions: Yes RLE Weight Bearing: Touchdown weight bearing LLE Weight Bearing: Weight bearing as tolerated ADL ADL Eating: Moderate assistance Where Assessed-Eating: Bed level Grooming: Dependent Where Assessed-Grooming: Bed level Upper Body  Bathing: Dependent Where Assessed-Upper Body Bathing: Bed level Lower Body Bathing: Dependent Where Assessed-Lower Body Bathing: Bed level Upper Body Dressing: Dependent Where Assessed-Upper Body Dressing: Bed level Lower Body Dressing: Dependent Where Assessed-Lower Body Dressing: Bed level Toileting: Dependent Where Assessed-Toileting: Bed level Toilet Transfer: Unable to assess Tub/Shower Transfer: Unable to assess Social research officer, government: Unable to assess Vision Baseline Vision/History: 1 Wears glasses (readers) Patient Visual Report: No change from baseline Vision Assessment?: No apparent visual deficits Perception  Perception: Within Functional Limits Praxis Praxis: Intact Cognition   Sensation Sensation Light Touch: Appears Intact Hot/Cold: Appears Intact Proprioception: Appears Intact Stereognosis: Not tested Coordination Gross Motor Movements are Fluid and Coordinated: No Fine Motor Movements are Fluid and Coordinated: No Coordination and Movement Description: grossly uncoordinated due to pain, lethargy/fatigue, decreased balance/postural control, and RLE TDWB precautions Finger Nose Finger Test: slower RUE>LUE Motor    Mobility     Trunk/Postural Assessment     Balance   Extremity/Trunk Assessment RUE Assessment RUE Assessment: Exceptions to [facility] Active Range of Motion (AROM) Comments: WFL General Strength Comments: 4-/5 grossly LUE Assessment LUE Assessment: Exceptions to [facility] Active Range of Motion (AROM) Comments: WFL General Strength Comments: 4-/5 grossly   Tirso Laws E Yovany Clock, MS, OTR/L  06/28/2021, 1:18 PM

## 2021-06-28 NOTE — Patient Care Conference (Signed)
Inpatient RehabilitationTeam Conference and Plan of Care Update Date: 06/28/2021   Time: 11:36 AM    Patient Name: Zoe Arnold      Medical Record Number: [id number]  Date of Birth: 1945-05-09 Sex: Female         Room/Bed: 4W02C/4W02C-01 Payor Info: Payor: MEDICARE / Plan: MEDICARE PART A AND B / Product Type: *No Product type* /    Admit Date/Time:  06/16/2021  5:03 PM  Primary Diagnosis:  Pelvic fracture [facility])  Hospital Problems: Principal Problem:   Pelvic fracture [facility])    Expected Discharge Date: Expected Discharge Date: 07/01/21  Team Members Present: Physician leading conference: Dr. Sula Soda Social Worker Present: Lavera Guise, BSW Nurse Present: Chana Bode, RN PT Present: Bernie Covey, PT OT Present: Candee Furbish, OT SLP Present: Colin Benton, SLP PPS Coordinator present : Fae Pippin, SLP     Current Status/Progress Goal Weekly Team Focus  Bowel/Bladder   cont/incont and is a heavy transfer so she hesitates to ask for help  toilet reguarly  ask patient to void at regular intervals   Swallow/Nutrition/ Hydration             ADL's   min A UB, max A LB, Max A toileting. Limited progress 2/2 self-limiting behaviors, distractability, pain and low BP  Grossly min A  family hands on training, ADL retraining with AE, activity tolerance, BP management, nutrition   Mobility   supine<>sit min A using bed featuyres, sit<>supine max A, sit<>stands and stand<>pivot transfers with RW and mod/min A, pt has refused gait training  supervision, CGA gait, min A steps  bed mobility, pain and BP management, safety awareness, functional mobility/transfers, generalized strengthening and endurance, standing balance with adherance to RLE TDWB precautions, and D/C planning.   Communication             Safety/Cognition/ Behavioral Observations            Pain   pt not complaining of any pain and is sleeping  decrease pain  to continue to work towards pain free  movement.   Skin   no breakdown           Discharge Planning:  discharging home with spouse. Waiting on follow up from family in reference to DME and family education   Team Discussion: Progress limited by fatigue, pain and behavior. Patient declining recommendations for DME and gait training. Poor safety awareness and limited participation with therapy.  Patient on target to meet rehab goals: no, currently needs min assist for transfers and only able to ambulate 5'. Goals for discharge set for min assist.  *See Care Plan and progress notes for long and short-term goals.   Revisions to Treatment Plan:  N/A   Teaching Needs: Safety, transfers, toileting, medications, etc  Current Barriers to Discharge: Decreased caregiver support, Home enviroment access/layout, and Behavior  Possible Resolutions to Barriers: Family education HH follow up services DME: hospital bed, RW, W/C, 3N1 Ramp recommended for access to home     Medical Summary Current Status: fatigue, insomnia, obesity, pain from pelvic fracture, loss of appetite, dizziness, poor safety awareness, hypertension at baseline but was hypotensive earlier in stay  Barriers to Discharge: Medical stability;Behavior;Weight  Barriers to Discharge Comments: fatigue, insomnia, obesity, pain from pelvic fracture, loss of appetite, dizziness, poor safety awareness, hypertension at baseline but was hypotensive earlier in stay Possible Resolutions to [employer] Focus: discontinue modafinil, provide dietary education, add gabapentin 100mg  HS, continue to monitor blood pressure TID, continue decreased  dose of Losartan   Continued Need for Acute Rehabilitation Level of Care: The patient requires daily medical management by a physician with specialized training in physical medicine and rehabilitation for the following reasons: Direction of a multidisciplinary physical rehabilitation program to maximize functional independence :  Yes Medical management of patient stability for increased activity during participation in an intensive rehabilitation regime.: Yes Analysis of laboratory values and/or radiology reports with any subsequent need for medication adjustment and/or medical intervention. : Yes   I attest that I was present, lead the team conference, and concur with the assessment and plan of the team.   Chana Bode B 06/28/2021, 4:39 PM

## 2021-06-28 NOTE — Progress Notes (Signed)
Occupational Therapy Session Note  Patient Details  Name: Zoe Arnold MRN: [id number] Date of Birth: 12/03/1945  Today's Date: 06/28/2021 OT Individual Time: 3212-2482 OT Individual Time Calculation (min): 70 min    Short Term Goals: Week 2:  OT Short Term Goal 1 (Week 2): STG = LTG 2/2 ELOS  Skilled Therapeutic Interventions/Progress Updates:  Skilled OT intervention completed with focus on activity tolerance, functional transfers, DME education and d/c planning. Pt received upright in bed, no c/o at rest however unrated pain in R hip with bed mobility (pre-medicated), with rest breaks and repositioning provided for pain reduction. Bed mobility completed with min A for RLE and trunk assist, then sit > stand using RW with min A then stand pivot to w/c with min A. Improved trunk posture during transfer this session. Pt with significant dry mouth, with therapist providing soda in room per request. Extensive education provided about home management, DME (3 in 1 BSC recommendation) for toileting, and HH services, however pt with little to no receptiveness to education attempted, with pt stating "we all need to come together to figure things out." Tried to reinforce pt's approaching d/c with family need for hands on training however limited ability for therapist to get through to pt stating "my lawyer will hire whatever I need." Communicated to CSW about recommendation of BSC, and min-mod A for physical assist at home, however pt not qualified for home caregiver and essential for family to attend family ed in order to prepare family and pt for safe discharge. Transported dependently in w/c <> hallway for functional ambulation. With max encouragement pt agreeable to ambulate in hallway for functional endurance. Sit > stand from w/c with min A using RW, then ambulated 5 ft using RW with w/c follow, with pt c/o BUE weakness before c/o BLE weakness. Improved adherence with TDWB on RLE.  BP measures assessed  as follows: Semi-supine: 112/61 Sitting EOB: 93/60 After stand pivot to w/c: 102/63 Donned TEDS dependently for BP management After ambulation: 125/60  Transitioned stand pivot using RW to recliner with min A, with w/c cushion placed on seat for pressure relief, with pillows behind back for comfort. BLE left elevated, with belt alarm on. Pt remained in recliner in position of comfort, with all immediate needs met at end of session.   Therapy Documentation Precautions:  Precautions Precautions: Fall Precaution Comments: Weightbearing restrictions Restrictions Weight Bearing Restrictions: Yes RLE Weight Bearing: Touchdown weight bearing LLE Weight Bearing: Weight bearing as tolerated    Therapy/Group: Individual Therapy  Tirrell Buchberger E Elan Mcelvain 06/28/2021, 7:43 AM

## 2021-06-28 NOTE — Progress Notes (Signed)
PROGRESS NOTE   Subjective/Complaints: Continues to feel unwell Did not sleep well again last night. Discussed stopping modafinil and starting Gabapentin at night  ROS: Patient denies fever, rash, sore throat, blurred vision, dizziness, nausea, vomiting, diarrhea, cough, shortness of breath or chest pain,   headache, or mood change. +insomnia, +pelvic fracture pain  Objective:   No results found. Recent Labs    06/26/21 0504  WBC 5.1  HGB 9.6*  HCT 31.0*  PLT 461*   Recent Labs    06/26/21 0504  NA 138  K 4.5  CL 106  CO2 27  GLUCOSE 98  BUN 20  CREATININE 0.91  CALCIUM 9.3    Intake/Output Summary (Last 24 hours) at 06/28/2021 1124 Last data filed at 06/27/2021 1839 Gross per 24 hour  Intake 0 ml  Output --  Net 0 ml         Physical Exam: Vital Signs Blood pressure 110/65, pulse 91, temperature 98.4 F (36.9 C), temperature source Oral, resp. rate 17, height 5\' 2"  (1.575 m), weight 78.2 kg, SpO2 94 %.  Constitutional: No distress . Vital signs reviewed. Sleepy, BMI 31.53 HEENT: NCAT, EOMI, oral membranes moist Neck: supple Cardiovascular: RRR without murmur. No JVD    Respiratory/Chest: CTA Bilaterally without wheezes or rales. Normal effort    GI/Abdomen: BS +, non-tender, non-distended Ext: no clubbing, cyanosis, or edema Psych: pleasant but anxious. Lack of motivation  Musculoskeletal:        General: No swelling. RLE 3/5, LLE 4/5.  Has pain with movement of pelvis and  LE's R>L Skin:    General: Skin is warm and dry. +MASD to sacrum. Incisions c/d/i Neurological:     General: No focal deficit present.     Mental Status: She is alert and oriented to person, place, and time.  Slow at taking medications in the morning       Assessment/Plan: 1. Functional deficits which require 3+ hours per day of interdisciplinary therapy in a comprehensive inpatient rehab setting. Physiatrist is providing  close team supervision and 24 hour management of active medical problems listed below. Physiatrist and rehab team continue to assess barriers to discharge/monitor patient progress toward functional and medical goals  Care Tool:  Bathing  Bathing activity did not occur: Safety/medical concerns Body parts bathed by patient: Face   Body parts bathed by helper: Buttocks     Bathing assist Assist Level: Set up assist     Upper Body Dressing/Undressing Upper body dressing Upper body dressing/undressing activity did not occur (including orthotics): Safety/medical concerns What is the patient wearing?: Dress    Upper body assist Assist Level: Minimal Assistance - Patient > 75%    Lower Body Dressing/Undressing Lower body dressing      What is the patient wearing?: Incontinence brief     Lower body assist Assist for lower body dressing: Maximal Assistance - Patient 25 - 49% (stance)     Toileting Toileting Toileting Activity did not occur (Clothing management and hygiene only): N/A (no void or bm)  Toileting assist Assist for toileting: Maximal Assistance - Patient 25 - 49%     Transfers Chair/bed transfer  Transfers assist  Chair/bed transfer  activity did not occur: Safety/medical concerns (pain, weakness/deconditioning, lethargic)  Chair/bed transfer assist level: Minimal Assistance - Patient > 75%     Locomotion Ambulation   Ambulation assist   Ambulation activity did not occur: Safety/medical concerns (pain, weakness/deconditioning, lethargic)          Walk 10 feet activity   Assist  Walk 10 feet activity did not occur: Safety/medical concerns (pain, weakness/deconditioning, lethargic)        Walk 50 feet activity   Assist Walk 50 feet with 2 turns activity did not occur: Safety/medical concerns (pain, weakness/deconditioning, lethargic)         Walk 150 feet activity   Assist Walk 150 feet activity did not occur: Safety/medical concerns  (pain, weakness/deconditioning, lethargic)         Walk 10 feet on uneven surface  activity   Assist Walk 10 feet on uneven surfaces activity did not occur: Safety/medical concerns (pain, weakness/deconditioning, lethargic)         Wheelchair     Assist Is the patient using a wheelchair?: Yes Type of Wheelchair: Manual Wheelchair activity did not occur: Safety/medical concerns (pain, weakness/deconditioning, lethargic)  Wheelchair assist level: Supervision/Verbal cueing Max wheelchair distance: >14450ft    Wheelchair 50 feet with 2 turns activity    Assist    Wheelchair 50 feet with 2 turns activity did not occur: Safety/medical concerns (pain, weakness/deconditioning, lethargic)   Assist Level: Supervision/Verbal cueing   Wheelchair 150 feet activity     Assist  Wheelchair 150 feet activity did not occur: Safety/medical concerns (pain, weakness/deconditioning, lethargic)   Assist Level: Supervision/Verbal cueing   Blood pressure 110/65, pulse 91, temperature 98.4 F (36.9 C), temperature source Oral, resp. rate 17, height 5\' 2"  (1.575 m), weight 78.2 kg, SpO2 94 %.   Medical Problem List and Plan: 1. Functional deficits secondary to pelvic fracture             -patient may shower             -ELOS/Goals: 20-22 days S/MinA              -Therapy has been complicated by pain/fatigue, but this is improving.   -Continue CIR therapies including PT, OT 2.  Impaired mobility and ADLs: Continue apixaban 2.5 mg for 30 days (stop 6/17). Order hospital bed.             -antiplatelet therapy: ? restart home aspirin 81 mg 3. Pain from pelvic fracture: Currently receiving scheduled Tylenol and Valium BID. Using Ultram 100 mg approximately once a day and oxycodone 5 mg infrequently (q 4 hours prn). D/c Dilaudid. Robaxin started yesterday. Add vitamin C 1,000mg  daily. Provided list of foods to help with pain. Add kpad. Start Gabapentin 100mg  HS  5/28- Kpad not available-  will increase Oxy to 5-10 mg q4 hours prn for now. So won't use IV Dilaudid as much.   5/29 using kpad this morning. She feels that she has "enough" medications, and i'm frankly hesitant to increase anything else at this point   -encouraged use of lidocaine patches 4. Mood: LCSW to evaluate and provide emotional support             -antipsychotic agents: n/a 5. Neuropsych: This patient is capable of making decisions on her own behalf. 6. Skin/Wound Care: Routine skin care checks 7. Fluids/Electrolytes/Nutrition: routine Is and Os and follow-up chemistries             --avoid dairy due to lactose intolerance 8: Pelvic  fracture s/p percutaneous fixation             --WBAT on left lower extremity             --TDWB on right lower extremity             --continue Vit D supplement weekly             --plan follow-up x-rays on 2 weeks per ortho 9: ABLA/small RP hematoma: stable; monitor H and H  -5/29 hgb 9.6 10: Urinary retention: Foley removed 5/19; monitor for spontaneous void --Flomax started 5/17 0.4 mg daily Resolved, weaning medications 11: Hyperlipidemia: continue Lipitor 12: Hypokalemia: K+ 40 mEq BID; follow-up BMP 13: Hypotension w/ history of HTN --continue home Toprol XL 25 mg q HS             --d/c HCTZ              --continue losartan 100 mg daily             -increase magnesium gluconate to 500mg  HS  -decreased valium to HS  -5/29 still soft---reduce losartan to 50mg  14: Lactose intolerant: avoid dairy; Lactaid tabs TID 15. Vitamin D deficiency: Level is 15.32: continue ergocalciferol 50,000U once per week for 7 weeks 16. Fatigue: IM B12 ordered. Idecrease Modafinil to 100mg  in case impacting sleep at night.  17. Obesity: BMI 31.77: provided list of foods to help with weight loss.  18. UTI: >100,000 Klebsiella, start Bactrim, sensitive to Bactrim, discussed with patient.  19. Confusion: wean urecholine to 5mg  daily since voiding better  5/27- confusion much better 20.  Constipation?  5/27- KUB shows no moderate/large stool burden- will wait on adding meds.   5/29 only a smear on 5/27   -try sorbitol today   -dulcolax suppository   -add senna-s at bedtime 21. Insomnia: discontinue modafinil 22. Muscle soreness: Bengay ordered.         LOS: 12 days A FACE TO FACE EVALUATION WAS PERFORMED  6/27 P Burkley Dech 06/28/2021, 11:24 AM

## 2021-06-28 NOTE — Plan of Care (Signed)
  Problem: RH Tub/Shower Transfers Goal: LTG Patient will perform tub/shower transfers w/assist (OT) Description: LTG: Patient will perform tub/shower transfers with assist, with/without cues using equipment (OT) Outcome: Not Applicable Flowsheets (Taken 06/28/2021 1324) LTG: Pt will perform tub/shower stall transfers with assistance level of: (d/c) -- Note: DC goal, 2/2 pt refusal to shower or attempt transfer during OT sessions

## 2021-06-28 NOTE — Progress Notes (Signed)
Patient ID: Zoe Arnold, female   DOB: May 13, 1945, 76 y.o.   MRN: [id number]  Patient [facility] referral sent to [facility]

## 2021-06-29 MED ORDER — HYDROCORTISONE (PERIANAL) 2.5 % EX CREA
TOPICAL_CREAM | Freq: Three times a day (TID) | CUTANEOUS | Status: DC
  Administered 2021-06-29: 1 via RECTAL
  Filled 2021-06-29: qty 28.35

## 2021-06-29 NOTE — Progress Notes (Signed)
Inpatient Rehabilitation Care Coordinator Discharge Note   Patient Details  Name: Zoe Arnold MRN: [id number] Date of Birth: [birth year]   Discharge location: Home  Length of Stay: 15 Days  Discharge activity level: Supervision/Min A  Home/community participation: Spouse and children  Patient response SJ:GGEZMO Literacy - How often do you need to have someone help you when you read instructions, pamphlets, or other written material from your doctor or pharmacy?: Never  Patient response QH:UTMLYY Isolation - How often do you feel lonely or isolated from those around you?: Never  Services provided included: MD, RD, PT, OT, SLP, RN, CM, TR, Pharmacy, SW  Financial Services:  Financial Services Utilized: Medicare    Choices offered to/list presented to: patient  Follow-up services arranged:  Home Health Home Health Agency: [facility]         Patient response to transportation need: Is the patient able to respond to transportation needs?: Yes In the past 12 months, has lack of transportation kept you from medical appointments or from getting medications?: No In the past 12 months, has lack of transportation kept you from meetings, work, or from getting things needed for daily living?: No    Comments (or additional information):  Patient/Family verbalized understanding of follow-up arrangements:  Yes  Individual responsible for coordination of the follow-up plan: Duwayne Heck (Daughter) [phone]  Confirmed correct DME delivered: Andria Rhein 06/29/2021    Andria Rhein

## 2021-06-29 NOTE — Plan of Care (Signed)
  Problem: RH Ambulation Goal: LTG Patient will ambulate in controlled environment (PT) Description: LTG: Patient will ambulate in a controlled environment, # of feet with assistance (PT). Outcome: Not Applicable Flowsheets (Taken 06/29/2021 0721) LTG: Pt will ambulate in controlled environ  assist needed:: (D/C due to refusal) -- Note: D/C due to refusal Goal: LTG Patient will ambulate in home environment (PT) Description: LTG: Patient will ambulate in home environment, # of feet with assistance (PT). Outcome: Not Applicable Flowsheets (Taken 06/29/2021 0721) LTG: Pt will ambulate in home environ  assist needed:: (D/C due to refusal) -- Note: D/C due to refusal   Problem: RH Stairs Goal: LTG Patient will ambulate up and down stairs w/assist (PT) Description: LTG: Patient will ambulate up and down # of stairs with assistance (PT) Outcome: Not Applicable Flowsheets (Taken 06/29/2021 0721) LTG: Pt will ambulate up/down stairs assist needed:: (D/C) -- Note: D/C

## 2021-06-29 NOTE — Progress Notes (Signed)
Recreational Therapy Discharge Summary Patient Details  Name: ARLENYS KESLER MRN: [id number] Date of Birth: [birth year] Today's Date: 06/29/2021  Comments on progress toward goals: Pt participated in multiple TR sessions during LOS with an emphasis on pt education.  Topics included activity analysis/modifications, 5 domains of wellness and their impact on rehab and recovery and stress management/coping.  LRT also present/observing during a couple PT sessions  to provide emotional support and encouragement.  Pt appreciative of support and at times tearful about her current medical status and continued recovery.  Pt has made slow progress during LOS due to c/o pain, fatigue, low BP and poor adherence to WB precautions.  Pt is looking forward to discharge and states they will figure it out once she's home.  Reasons for discharge: discharge from hospital  Follow-up: [place]aly agrees with progress made and goals achieved: Yes  Taneya Conkel 06/29/2021, 11:37 AM

## 2021-06-29 NOTE — Progress Notes (Signed)
Occupational Therapy Session Note  Patient Details  Name: Zoe Arnold MRN: [id number] Date of Birth: [birth year]  Today's Date: 06/29/2021 OT Individual Time: 1015-1100 OT Individual Time Calculation (min): 45 min    Short Term Goals: Week 1:  OT Short Term Goal 1 (Week 1): Pt will transfer to EOB with max A in prep for seated ADL. OT Short Term Goal 1 - Progress (Week 1): Met OT Short Term Goal 2 (Week 1): Pt will self-feed 75% of meal with no more than min A. OT Short Term Goal 2 - Progress (Week 1): Met OT Short Term Goal 3 (Week 1): Pt will complete 1 grooming task with set-up A at bed level. OT Short Term Goal 3 - Progress (Week 1): Met OT Short Term Goal 4 (Week 1): Pt will participate in full OT session in 2/3 attempts. OT Short Term Goal 4 - Progress (Week 1): Met Week 2:  OT Short Term Goal 1 (Week 2): STG = LTG 2/2 ELOS  Skilled Therapeutic Interventions/Progress Updates:    1:1 Pt received in the w/c and reported ongoing pain and reports she is trying her best. Pt w/c taken to the door of the bathroom and pt ambulated into the bathroom with RW to [facility] over the commode (and padded with towels for comfort) with contact guard. Pt did allow left Le to drag behind her but with cuing to keep legs closer may help with her pain. Pt sat on toilet for a prolonged time to see if she could have a much needed bowel movement. Ot noticed pt's ROHO cushion was deflated and not allowing pt enough support. Readjusted air in cushion and place in recliner. PT was abl eto perform hygiene in sitting and in standing with supervision. A with pulling up brief and adjusting pants. Pt ambulated back to recliner in the room next to the bed with contact guard with extra time and cues for RW management over the bathroom floor threshold.   Pt reported feeling more comfortable in the recliner with adjustments with cushion and positioning LEs higher. Safety measures in place.   Therapy Documentation Precautions:   Precautions Precautions: Fall Precaution Comments: Weightbearing restrictions Restrictions Weight Bearing Restrictions: Yes RUE Weight Bearing: Weight bearing as tolerated LUE Weight Bearing: Weight bearing as tolerated RLE Weight Bearing: Touchdown weight bearing LLE Weight Bearing: Weight bearing as tolerated General:   Vital Signs: Therapy Vitals Temp: 98.5 F (36.9 C) Pulse Rate: 91 Resp: 18 BP: 116/62 Patient Position (if appropriate): Sitting Oxygen Therapy SpO2: 96 % O2 Device: Room Air Pain: Ongoing pain in pelvic and buttocks region- allowed for repositioning and applied ice to front right groin area and right buttocks in recliner   Therapy/Group: Individual Therapy  Willeen Cass [facility] 06/29/2021, 4:07 PM

## 2021-06-29 NOTE — Progress Notes (Signed)
PROGRESS NOTE   Subjective/Complaints: Feeling ok this morning Slept well last night Continues to have pain, provided with pain relief journal  ROS: Patient denies fever, rash, sore throat, blurred vision, dizziness, nausea, vomiting, diarrhea, cough, shortness of breath or chest pain,   headache, or mood change. +insomnia, +pelvic fracture pain- continues   Objective:   No results found. No results for input(s): WBC, HGB, HCT, PLT in the last 72 hours.  No results for input(s): NA, K, CL, CO2, GLUCOSE, BUN, CREATININE, CALCIUM in the last 72 hours.   Intake/Output Summary (Last 24 hours) at 06/29/2021 1519 Last data filed at 06/28/2021 2200 Gross per 24 hour  Intake 240 ml  Output --  Net 240 ml         Physical Exam: Vital Signs Blood pressure 116/62, pulse 91, temperature 98.5 F (36.9 C), resp. rate 18, height 5\' 2"  (1.575 m), weight 78.2 kg, SpO2 96 %.  Constitutional: No distress . Vital signs reviewed. BMI 31.53, fatigued HEENT: NCAT, EOMI, oral membranes moist Neck: supple Cardiovascular: RRR without murmur. No JVD    Respiratory/Chest: CTA Bilaterally without wheezes or rales. Normal effort    GI/Abdomen: BS +, non-tender, non-distended Ext: no clubbing, cyanosis, or edema Psych: pleasant but anxious. Lack of motivation  Musculoskeletal:        General: No swelling. RLE 3/5, LLE 4/5.  Has pain with movement of pelvis and  LE's R>L Skin:    General: Skin is warm and dry. +MASD to sacrum. Incisions c/d/i Neurological:     General: No focal deficit present.     Mental Status: She is alert and oriented to person, place, and time.  Slow at taking medications in the morning       Assessment/Plan: 1. Functional deficits which require 3+ hours per day of interdisciplinary therapy in a comprehensive inpatient rehab setting. Physiatrist is providing close team supervision and 24 hour management of active  medical problems listed below. Physiatrist and rehab team continue to assess barriers to discharge/monitor patient progress toward functional and medical goals  Care Tool:  Bathing  Bathing activity did not occur: Safety/medical concerns Body parts bathed by patient: Face   Body parts bathed by helper: Buttocks     Bathing assist Assist Level: Set up assist     Upper Body Dressing/Undressing Upper body dressing Upper body dressing/undressing activity did not occur (including orthotics): Safety/medical concerns What is the patient wearing?: Dress    Upper body assist Assist Level: Minimal Assistance - Patient > 75%    Lower Body Dressing/Undressing Lower body dressing      What is the patient wearing?: Incontinence brief     Lower body assist Assist for lower body dressing: Maximal Assistance - Patient 25 - 49% (stance)     Toileting Toileting Toileting Activity did not occur (Clothing management and hygiene only): N/A (no void or bm)  Toileting assist Assist for toileting: Maximal Assistance - Patient 25 - 49%     Transfers Chair/bed transfer  Transfers assist  Chair/bed transfer activity did not occur: Safety/medical concerns (pain, weakness/deconditioning, lethargic)  Chair/bed transfer assist level: Contact Guard/Touching assist     Locomotion Ambulation  Ambulation assist   Ambulation activity did not occur: Safety/medical concerns (pain, weakness/deconditioning, lethargic)  Assist level: Minimal Assistance - Patient > 75% Assistive device: Walker-rolling Max distance: 5 ft   Walk 10 feet activity   Assist  Walk 10 feet activity did not occur: Safety/medical concerns (pain, weakness/deconditioning, lethargic)        Walk 50 feet activity   Assist Walk 50 feet with 2 turns activity did not occur: Safety/medical concerns (pain, weakness/deconditioning, lethargic)         Walk 150 feet activity   Assist Walk 150 feet activity did not  occur: Safety/medical concerns (pain, weakness/deconditioning, lethargic)         Walk 10 feet on uneven surface  activity   Assist Walk 10 feet on uneven surfaces activity did not occur: Safety/medical concerns (pain, weakness/deconditioning, lethargic)         Wheelchair     Assist Is the patient using a wheelchair?: Yes Type of Wheelchair: Manual Wheelchair activity did not occur: Safety/medical concerns (pain, weakness/deconditioning, lethargic)  Wheelchair assist level: Supervision/Verbal cueing Max wheelchair distance: >15550ft    Wheelchair 50 feet with 2 turns activity    Assist    Wheelchair 50 feet with 2 turns activity did not occur: Safety/medical concerns (pain, weakness/deconditioning, lethargic)   Assist Level: Supervision/Verbal cueing   Wheelchair 150 feet activity     Assist  Wheelchair 150 feet activity did not occur: Safety/medical concerns (pain, weakness/deconditioning, lethargic)   Assist Level: Supervision/Verbal cueing   Blood pressure 116/62, pulse 91, temperature 98.5 F (36.9 C), resp. rate 18, height 5\' 2"  (1.575 m), weight 78.2 kg, SpO2 96 %.   Medical Problem List and Plan: 1. Functional deficits secondary to pelvic fracture             -patient may shower             -ELOS/Goals: 20-22 days S/MinA              -Therapy has been complicated by pain/fatigue, but this is improving.   -Continue CIR therapies including PT, OT 2.  Impaired mobility and ADLs: Continue apixaban 2.5 mg for 30 days (stop 6/17). Order hospital bed.             -antiplatelet therapy: ? restart home aspirin 81 mg 3. Pain from pelvic fracture: Currently receiving scheduled Tylenol and Valium BID. Using Ultram 100 mg approximately once a day and oxycodone 5 mg infrequently (q 4 hours prn). D/c Dilaudid. Robaxin started yesterday. Add vitamin C 1,000mg  daily. Provided list of foods to help with pain. Add kpad. Start Gabapentin 100mg  HS. Provided with pain  relief journal.   5/28- Kpad not available- will increase Oxy to 5-10 mg q4 hours prn for now. So won't use IV Dilaudid as much.   5/29 using kpad this morning. She feels that she has "enough" medications, and i'm frankly hesitant to increase anything else at this point   -encouraged use of lidocaine patches 4. Anxiety: discontinue valium due to sedation 5. Neuropsych: This patient is capable of making decisions on her own behalf. 6. Skin/Wound Care: Routine skin care checks 7. Fluids/Electrolytes/Nutrition: routine Is and Os and follow-up chemistries             --avoid dairy due to lactose intolerance 8: Pelvic fracture s/p percutaneous fixation             --WBAT on left lower extremity             --  TDWB on right lower extremity             --continue Vit D supplement weekly             --plan follow-up x-rays on 2 weeks per ortho 9: ABLA/small RP hematoma: stable; monitor H and H  -5/29 hgb 9.6 10: Urinary retention: Foley removed 5/19; monitor for spontaneous void --Flomax started 5/17 0.4 mg daily Resolved, weaning medications 11: Hyperlipidemia: continue Lipitor 12: Hypokalemia: K+ 40 mEq BID; follow-up BMP 13: Hypotension w/ history of HTN --continue home Toprol XL 25 mg q HS             --d/c HCTZ              --continue losartan 100 mg daily             -increase magnesium gluconate to 500mg  HS  -decreased valium to HS  -5/29 still soft---reduce losartan to 50mg  14: Lactose intolerant: avoid dairy; Lactaid tabs TID 15. Vitamin D deficiency: Level is 15.32: continue ergocalciferol 50,000U once per week for 7 weeks 16. Fatigue: IM B12 ordered. Idecrease Modafinil to 100mg  in case impacting sleep at night.  17. Obesity: BMI 31.77: provided list of foods to help with weight loss.  18. UTI: >100,000 Klebsiella, start Bactrim, sensitive to Bactrim, discussed with patient.  19. Confusion: wean urecholine to 5mg  daily since voiding better  5/27- confusion much better 20.  Constipation?  5/27- KUB shows no moderate/large stool burden- will wait on adding meds.   5/29 only a smear on 5/27   -try sorbitol today   -dulcolax suppository   -add senna-s at bedtime 21. Insomnia: discontinue modafinil 22. Muscle soreness: Bengay ordered.         LOS: 13 days A FACE TO FACE EVALUATION WAS PERFORMED  6/27 Zoe Arnold 06/29/2021, 3:19 PM

## 2021-06-29 NOTE — Progress Notes (Signed)
Physical Therapy Session Note  Patient Details  Name: Zoe Arnold MRN: [id number] Date of Birth: 1945/11/05  Today's Date: 06/29/2021 PT Individual Time: 5597-4163 PT Individual Time Calculation (min): 69 min   Short Term Goals: Week 1:  PT Short Term Goal 1 (Week 1): pt will roll L/R with mod A of 1 PT Short Term Goal 1 - Progress (Week 1): Met PT Short Term Goal 2 (Week 1): pt will transfer sit<>stand with LRAD and max A of 1 PT Short Term Goal 2 - Progress (Week 1): Met PT Short Term Goal 3 (Week 1): pt will transfer bed<>chair with LRAD and max A of 1 PT Short Term Goal 3 - Progress (Week 1): Met Week 2:  PT Short Term Goal 1 (Week 2): STG =LTG due to LOS  Skilled Therapeutic Interventions/Progress Updates:   Received pt semi-reclined in bed, pt immediately attempting to refuse scheduled session due to 6/10 pain in R hip/pelvis and "not being woken up yet", asking if PT can change schedule around. Explained tight schedules that therapists have and educated pt on importance of participating in scheduled therapy, especially with discharge being Saturday. PA arrived and also encouraged OOB mobility - pt continued to stall asking excessive questions. Recommended practicing car transfer this morning in preparation for Saturday, however pt stalling asking unnecessary questions about what the car simulator looks like, but ultimately dismissing recommendation. Pt with questions for social worker; therefore located CSW and CSW present during session. Pt upset that CSW did not talk with pt prior to calling family - CSW and therapist informed pt that we tried on multiple instance to explain recommendations to pt but since pt had not made any moves to prepare for discharge, CSW reached out to family - plan for family education tomorrow from 1-4pm. Discussed follow up therapy options and educated pt on recommendations for HHPT. Performed pain interference questionnaire, sensation, and MMT which took  unnecessary amount of time due to pt stalling. Pt reported urge to void, but requesting to use Stedy - informed pt that she will not have a Stedy at home, therefore will not use it in therapy. Updated safety plan to stand<>pivot and removed Stedy from room. Pt transferred semi-reclined<>sitting EOB with HOB elevated and use of bedrails with supervision, increased time, and use of bedrails with encouragement, as pt reaching for therapist's hand to assist. Pt transferred bed<>bedside commode stand<>pivot with RW and min A (poor adherence to RLE TDWB precautions) - required total A for clothing management. Pt able to void and donned clean brief with max A. Stood from bedside commode with RW and min A and performed peri-care in standing with min A (unsafe L elbow placement on RW, but pt unreceptive to cues). Pt transferred bedside commode<>WC stand<>pivot with RW and min/mod A due to posterior lean and generalized unsteadiness and plopped into WC, tipping RW backwards. Pt brushed teeth/washed face at sink with set up assist. Bedside commode delivered to room. Concluded session with pt sitting in WC, needs within reach, and seatbelt alarm on awaiting upcoming OT session.   Therapy Documentation Precautions:  Precautions Precautions: Fall Precaution Comments: Weightbearing restrictions Restrictions Weight Bearing Restrictions: Yes RUE Weight Bearing: Weight bearing as tolerated LUE Weight Bearing: Weight bearing as tolerated RLE Weight Bearing: Touchdown weight bearing LLE Weight Bearing: Weight bearing as tolerated  Therapy/Group: Individual Therapy Alfonse Alpers PT, DPT  06/29/2021, 6:53 AM

## 2021-06-29 NOTE — Progress Notes (Signed)
Occupational Therapy Session Note  Patient Details  Name: Zoe Arnold MRN: [id number] Date of Birth: [birth year]  Today's Date: 06/29/2021 OT Individual Time: 1300-1415 OT Individual Time Calculation (min): 75 min    Short Term Goals: Week 2:  OT Short Term Goal 1 (Week 2): STG = LTG 2/2 ELOS  Skilled Therapeutic Interventions/Progress Updates:    S: Reports that she is exhausted.    O:  - Functional transfers:  - Completed sit to stand and then stand pivot transfer using RW from recliner to wheelchair at contact guard assist.  - w/c to mat table and back to w/c transfer completed using RW at contact guard assist. - w/c to recliner transfer completed using RW at contact guard assist. - Strengthening: (seated on EOM with aerobic step under feet for proper seated posture)  - 2.2lb weighted ball; held in both hands, chest press, flexion, circles (right/left), 10X - BUE shoulder; used 3lb dowel rod, seated, 15X chest press, horizontal abduction/adduction - BUE shoulder, cross body punches 12X with 4# hand weight, seated   A: All transfers completed by leaning forward and performing a type of push-up technique. Sit to stand technique not ideal although it's the technique that patient preferred and it worked for her. When returning to recliner at end of session, patient experienced difficulty during her push up technique and it was recommended that patient attempt to push up from the arm rests this time to help stand. She was able to carry over this instruction with success.     P: Continue working on increasing independence with self care and functional transfers in preparation to return home.    Therapy Documentation Precautions:  Precautions Precautions: Fall Precaution Comments: Weightbearing restrictions Restrictions Weight Bearing Restrictions: Yes RLE Weight Bearing: Touchdown weight bearing LLE Weight Bearing: Weight bearing as tolerated  Pain: No pain reported during  session   Therapy/Group: Individual Therapy  Limmie Patricia, OTR/L,CBIS  Supplemental OT - [facility]  06/29/2021, 1:38 PM

## 2021-06-29 NOTE — Progress Notes (Signed)
Physical Therapy Discharge Summary  Patient Details  Name: Zoe Arnold MRN: [id number] Date of Birth: 1945/06/24  {CHL IP REHAB PT TIME CALCULATION:304800500}  Patient has met {NUMBERS 0-12:18577} of 8 long term goals due to improved activity tolerance, improved balance, increased strength, ability to compensate for deficits, and improved coordination.  Patient to discharge at a wheelchair level [place]. Recommended family education on multiple occasions, however pt refused stating "we will figure it out". Pt also dismissive of most of therapist recommendations including ramp, WC, and RW stating "I don't need it".   Reasons goals not met: ***  Recommendation:  Patient will benefit from ongoing skilled PT services in home health setting to continue to advance safe functional mobility, address ongoing impairments in transfers, generalized strengthening and endurance, dynamic standing balance/coordination, and to minimize fall risk.  Equipment: Have recommended hospital bed, RW, 18x18 manual WC, ramp  Reasons for discharge: treatment goals met and discharge from hospital  Patient/family agrees with progress made and goals achieved: Yes  PT Discharge Precautions/Restrictions Precautions Precautions: Fall Restrictions Weight Bearing Restrictions: Yes RLE Weight Bearing: Touchdown weight bearing LLE Weight Bearing: Weight bearing as tolerated Pain Interference Pain Interference Pain Effect on Sleep: 4. Almost constantly Pain Interference with Therapy Activities: 3. Frequently Pain Interference with Day-to-Day Activities: 4. Almost constantly Cognition Overall Cognitive Status: No family/caregiver present to determine baseline cognitive functioning Arousal/Alertness: Awake/alert Orientation Level: Oriented X4 Memory: Impaired Awareness: Impaired Problem Solving: Impaired Safety/Judgment: Impaired Comments: decreased insight into deficits. Thinks when she goes home everything  will just "work out" and very dismisive of recommendations for discharge planning. Sensation Sensation Light Touch: Appears Intact Proprioception: Appears Intact Additional Comments: pt getting L/R confused Coordination Gross Motor Movements are Fluid and Coordinated: No Fine Motor Movements are Fluid and Coordinated: No Coordination and Movement Description: uncoordinated due to pain, decreased balance strategies due to RLE TDWB precautions, and poor motivation Finger Nose Finger Test: slow bilaterally Heel Shin Test: unable to perform bilaterally - attempting to compensate using UEs and scooting foot across bed Motor     Mobility   Locomotion     Trunk/Postural Assessment  Cervical Assessment Cervical Assessment: Exceptions to [facility] (forward head) Thoracic Assessment Thoracic Assessment: Exceptions to [facility] (rounded shoulders) Lumbar Assessment Lumbar Assessment: Exceptions to [facility] (posterior pelvic tilt, offloads R hip due to pain) Postural Control Postural Control: Deficits on evaluation Trunk Control: decreased  Balance   Extremity Assessment       Blenda Nicely Becky Sax PT, DPT  06/29/2021, 6:56 AM

## 2021-06-29 NOTE — Progress Notes (Signed)
Pt refused milk of magnesia. Pt denies being constipated or difficulty with bowel movement today.  Mylo Red, LPN

## 2021-06-29 NOTE — Progress Notes (Addendum)
Patient ID: Zoe Arnold, female   DOB: [birth year], 76 y.o.   MRN: [id number]  Family education scheduled Friday, 6/2 1-4 PM  SW provided patient daughter Reuel Boom with patient recommendations of DME, Ramp and HH. Patient daughter will follow up on DME for patient.   Duwayne Heck (Daughter) [phone] Ramon Dredge (Son) [phone]

## 2021-06-30 ENCOUNTER — Other Ambulatory Visit (HOSPITAL_COMMUNITY): Payer: Self-pay

## 2021-06-30 MED ORDER — VITAMIN D (ERGOCALCIFEROL) 1.25 MG (50000 UNIT) PO CAPS
50000.0000 [IU] | ORAL_CAPSULE | ORAL | 0 refills | Status: AC
  Filled 2021-06-30: qty 5, 35d supply, fill #0

## 2021-06-30 MED ORDER — METOPROLOL SUCCINATE ER 25 MG PO TB24
25.0000 mg | ORAL_TABLET | Freq: Every day | ORAL | 0 refills | Status: AC
  Filled 2021-06-30: qty 30, 30d supply, fill #0

## 2021-06-30 MED ORDER — TAMSULOSIN HCL 0.4 MG PO CAPS
0.4000 mg | ORAL_CAPSULE | Freq: Every day | ORAL | 0 refills | Status: AC
  Filled 2021-06-30: qty 30, 30d supply, fill #0

## 2021-06-30 MED ORDER — RISAQUAD PO CAPS
1.0000 | ORAL_CAPSULE | Freq: Every day | ORAL | 0 refills | Status: AC
  Filled 2021-06-30: qty 30, 30d supply, fill #0

## 2021-06-30 MED ORDER — MAGNESIUM GLUCONATE 500 MG PO TABS
500.0000 mg | ORAL_TABLET | Freq: Every day | ORAL | 0 refills | Status: AC
  Filled 2021-06-30: qty 30, 30d supply, fill #0

## 2021-06-30 MED ORDER — MUSCLE RUB 10-15 % EX CREA: 1.0000 "application " | TOPICAL_CREAM | CUTANEOUS | 0 refills | Status: AC | PRN

## 2021-06-30 MED ORDER — TRAZODONE HCL 50 MG PO TABS
25.0000 mg | ORAL_TABLET | Freq: Every evening | ORAL | 0 refills | Status: AC | PRN
  Filled 2021-06-30: qty 15, 15d supply, fill #0

## 2021-06-30 MED ORDER — HYDROCORTISONE (PERIANAL) 2.5 % EX CREA
TOPICAL_CREAM | Freq: Three times a day (TID) | CUTANEOUS | 0 refills | Status: AC
Start: 2021-06-30 — End: ?

## 2021-06-30 MED ORDER — SENNOSIDES-DOCUSATE SODIUM 8.6-50 MG PO TABS: 2.0000 | ORAL_TABLET | Freq: Every day | ORAL | Status: AC

## 2021-06-30 MED ORDER — ACETAMINOPHEN 325 MG PO TABS
650.0000 mg | ORAL_TABLET | Freq: Three times a day (TID) | ORAL | Status: AC
Start: 2021-06-30 — End: ?

## 2021-06-30 MED ORDER — APIXABAN 2.5 MG PO TABS
2.5000 mg | ORAL_TABLET | Freq: Two times a day (BID) | ORAL | 0 refills | Status: AC
  Filled 2021-06-30: qty 26, 13d supply, fill #0

## 2021-06-30 MED ORDER — ASCORBIC ACID 1000 MG PO TABS
1000.0000 mg | ORAL_TABLET | Freq: Every day | ORAL | Status: AC
Start: 2021-06-30 — End: ?

## 2021-06-30 MED ORDER — GABAPENTIN 100 MG PO CAPS
100.0000 mg | ORAL_CAPSULE | Freq: Every day | ORAL | 0 refills | Status: AC
  Filled 2021-06-30: qty 30, 30d supply, fill #0

## 2021-06-30 MED ORDER — LOSARTAN POTASSIUM 50 MG PO TABS
50.0000 mg | ORAL_TABLET | Freq: Every day | ORAL | 0 refills | Status: AC
  Filled 2021-06-30: qty 30, 30d supply, fill #0

## 2021-06-30 MED ORDER — ATORVASTATIN CALCIUM 20 MG PO TABS
20.0000 mg | ORAL_TABLET | Freq: Every day | ORAL | 0 refills | Status: AC
  Filled 2021-06-30: qty 30, 30d supply, fill #0

## 2021-06-30 MED ORDER — ZINC OXIDE 20 % EX OINT
TOPICAL_OINTMENT | CUTANEOUS | 0 refills | Status: AC | PRN
Start: 2021-06-30 — End: ?

## 2021-06-30 MED ORDER — OXYCODONE HCL 5 MG PO TABS
5.0000 mg | ORAL_TABLET | ORAL | 0 refills | Status: AC | PRN
Start: 2021-06-30 — End: ?
  Filled 2021-06-30: qty 30, 3d supply, fill #0

## 2021-06-30 NOTE — Progress Notes (Signed)
Pt states "my home bed will not be ready until Tue 07/21/21 and she is not ready for discharge tomorrow. Family at bedside and states they did not receive proper discharge instructions.

## 2021-06-30 NOTE — Progress Notes (Signed)
Physical Therapy Session Note  Patient Details  Name: Zoe Arnold MRN: [id number] Date of Birth: 15-Oct-1945  Today's Date: 06/30/2021 PT Individual Time: 2992-4268 PT Individual Time Calculation (min): 47 min  Today's Date: 06/30/2021 PT Missed Time: 13 Minutes Missed Time Reason: Pain;Patient unwilling to participate  Short Term Goals: Week 1:  PT Short Term Goal 1 (Week 1): pt will roll L/R with mod A of 1 PT Short Term Goal 1 - Progress (Week 1): Met PT Short Term Goal 2 (Week 1): pt will transfer sit<>stand with LRAD and max A of 1 PT Short Term Goal 2 - Progress (Week 1): Met PT Short Term Goal 3 (Week 1): pt will transfer bed<>chair with LRAD and max A of 1 PT Short Term Goal 3 - Progress (Week 1): Met Week 2:  PT Short Term Goal 1 (Week 2): STG =LTG due to LOS  Skilled Therapeutic Interventions/Progress Updates:   Received pt semi-reclined in bed not prepared for family education and with family not present. Pt attempting to refuse OOB mobility due to 10/10 pain in R hip/pelvis. Therapist informed pt that this was her last session and emphasized importance of getting OOB in case family arrived. [name] WC and RW delivered; therefore adjusted equipment. Session with emphasis on discharge planning, functional mobility/transfers, generalized strengthening and endurance, simulated car transfers, and dynamic standing balance/coordination. Pt transferred semi-reclined<>sitting EOB with HOB elevated and use of bedrails with supervision with an unnecessary amount of time. Stand<>pivot bed<>WC with RW and min A and husband arrived. [name] husband expressing concerns stating pt is not ready to go home and is concerned with pain and mobility status. Informed treatment team but notified husband that we have been trying to prepare for D/C since admission and pt has refused certain recommendations, has poor motivation to participate in scheduled therapy sessions, and has refused family education or to allow  this therapist to contact family to address concerns. Pt transported to/from ortho gym in [facility] dependently. Pt performed simulated car transfer with RW and min A provided by husband - educated pt's husband on body mechanics/positioning and safety with transfers - pt required min A to get RLE into car. Discussed stairs to enter home, and expressed to husband that this therapist told pt over a week ago that she will require a ramp. Pt confirmed that her family members would be able to carry her up the stairs in her [facility] and if they decide they need a ramp, they will put one in. Offered ambulance transport home, however pt refused. Pt then telling PT that she thinks she can get up the steps on her own - therefore demonstrated technique for "hopping" up stairs backwards while adhering to RLE TDWB precautions to demonstrate difficulty, to which husband responded with "oh no, she can't do that" - thus proving therapist's point - pt nodded silently in agreement. Encouraged ambulation to show husband technique, however pt refused. Then encouraged performing WC mobility to demonstrate to husband - pt propelled 81f with supervision then proceeded to have husband transport her back to room. Educated husband on [employer] management including donning/doffing legrests but ultimately pt refused any further participation. Concluded session with pt sitting in WC, all needs within reach, and family present at bedside. 13 minutes missed of skilled physical therapy due to pain and unwillingness to participate further.   Therapy Documentation Precautions:  Precautions Precautions: Fall Precaution Comments: Weightbearing restrictions Restrictions Weight Bearing Restrictions: Yes RLE Weight Bearing: Touchdown weight bearing LLE Weight Bearing:  Weight bearing as tolerated  Therapy/Group: Individual Therapy Alfonse Alpers PT, DPT  06/30/2021, 7:12 AM

## 2021-06-30 NOTE — Progress Notes (Signed)
PROGRESS NOTE   Subjective/Complaints: Still feeling unwell  ROS: Patient denies fever, rash, sore throat, blurred vision, dizziness, nausea, vomiting, diarrhea, cough, shortness of breath or chest pain,   headache, or mood change. +insomnia, +pelvic fracture pain- continues   Objective:   No results found. No results for input(s): WBC, HGB, HCT, PLT in the last 72 hours.  No results for input(s): NA, K, CL, CO2, GLUCOSE, BUN, CREATININE, CALCIUM in the last 72 hours.   Intake/Output Summary (Last 24 hours) at 06/30/2021 1101 Last data filed at 06/29/2021 1900 Gross per 24 hour  Intake 0 ml  Output --  Net 0 ml         Physical Exam: Vital Signs Blood pressure (!) 118/59, pulse 88, temperature 98 F (36.7 C), temperature source Oral, resp. rate 15, height 5\' 2"  (1.575 m), weight 78.2 kg, SpO2 95 %.  Constitutional: No distress . Vital signs reviewed. BMI 31.53, fatigued HEENT: NCAT, EOMI, oral membranes moist Neck: supple Cardiovascular: RRR without murmur. No JVD    Respiratory/Chest: CTA Bilaterally without wheezes or rales. Normal effort    GI/Abdomen: BS +, non-tender, non-distended Ext: no clubbing, cyanosis, or edema Psych: pleasant but anxious. Lack of motivation  Musculoskeletal:        General: No swelling. RLE 3/5, LLE 4/5.  Has pain with movement of pelvis and  LE's R>L Sit to stand CG with RW Skin:    General: Skin is warm and dry. +MASD to sacrum. Incisions c/d/i Neurological:     General: No focal deficit present.     Mental Status: She is alert and oriented to person, place, and time.  Slow at taking medications in the morning    Assessment/Plan: 1. Functional deficits which require 3+ hours per day of interdisciplinary therapy in a comprehensive inpatient rehab setting. Physiatrist is providing close team supervision and 24 hour management of active medical problems listed below. Physiatrist  and rehab team continue to assess barriers to discharge/monitor patient progress toward functional and medical goals  Care Tool:  Bathing  Bathing activity did not occur: Safety/medical concerns Body parts bathed by patient: Face   Body parts bathed by helper: Buttocks     Bathing assist Assist Level: Set up assist     Upper Body Dressing/Undressing Upper body dressing Upper body dressing/undressing activity did not occur (including orthotics): Safety/medical concerns What is the patient wearing?: Dress    Upper body assist Assist Level: Minimal Assistance - Patient > 75%    Lower Body Dressing/Undressing Lower body dressing      What is the patient wearing?: Incontinence brief     Lower body assist Assist for lower body dressing: Maximal Assistance - Patient 25 - 49% (stance)     Toileting Toileting Toileting Activity did not occur (Clothing management and hygiene only): N/A (no void or bm)  Toileting assist Assist for toileting: Moderate Assistance - Patient 50 - 74%     Transfers Chair/bed transfer  Transfers assist  Chair/bed transfer activity did not occur: Safety/medical concerns (pain, weakness/deconditioning, lethargic)  Chair/bed transfer assist level: Minimal Assistance - Patient > 75%     Locomotion Ambulation   Ambulation assist  Ambulation activity did not occur: Safety/medical concerns (pain, weakness/deconditioning, lethargic)  Assist level: Minimal Assistance - Patient > 75% Assistive device: Walker-rolling Max distance: 5 ft   Walk 10 feet activity   Assist  Walk 10 feet activity did not occur: Safety/medical concerns (pain, weakness/deconditioning, lethargic)        Walk 50 feet activity   Assist Walk 50 feet with 2 turns activity did not occur: Safety/medical concerns (pain, weakness/deconditioning, lethargic)         Walk 150 feet activity   Assist Walk 150 feet activity did not occur: Safety/medical concerns (pain,  weakness/deconditioning, lethargic)         Walk 10 feet on uneven surface  activity   Assist Walk 10 feet on uneven surfaces activity did not occur: Safety/medical concerns (pain, weakness/deconditioning, lethargic)         Wheelchair     Assist Is the patient using a wheelchair?: Yes Type of Wheelchair: Manual Wheelchair activity did not occur: Safety/medical concerns (pain, weakness/deconditioning, lethargic)  Wheelchair assist level: Supervision/Verbal cueing Max wheelchair distance: >161ft    Wheelchair 50 feet with 2 turns activity    Assist    Wheelchair 50 feet with 2 turns activity did not occur: Safety/medical concerns (pain, weakness/deconditioning, lethargic)   Assist Level: Supervision/Verbal cueing   Wheelchair 150 feet activity     Assist  Wheelchair 150 feet activity did not occur: Safety/medical concerns (pain, weakness/deconditioning, lethargic)   Assist Level: Supervision/Verbal cueing   Blood pressure (!) 118/59, pulse 88, temperature 98 F (36.7 C), temperature source Oral, resp. rate 15, height 5\' 2"  (1.575 m), weight 78.2 kg, SpO2 95 %.   Medical Problem List and Plan: 1. Functional deficits secondary to pelvic fracture             -patient may shower             -ELOS/Goals: 20-22 days S/MinA              -Therapy has been complicated by pain/fatigue, but this is improving.   -Continue CIR therapies including PT, OT 2.  Impaired mobility and ADLs: Continue apixaban 2.5 mg for 30 days (stop 6/17). Order hospital bed.             -antiplatelet therapy: ? restart home aspirin 81 mg 3. Pain from pelvic fracture: Currently receiving scheduled Tylenol and Valium BID. Using Ultram 100 mg approximately once a day and oxycodone 5 mg infrequently (q 4 hours prn). D/c Dilaudid. Robaxin started yesterday. Add vitamin C 1,000mg  daily. Provided list of foods to help with pain. Add kpad. Start Gabapentin 100mg  HS. Provided with pain relief  journal. Discussed increasing Gabapentin dose HS and she will discuss with her husband  5/28- Kpad not available- will increase Oxy to 5-10 mg q4 hours prn for now. So won't use IV Dilaudid as much.   5/29 using kpad this morning. She feels that she has "enough" medications, and i'm frankly hesitant to increase anything else at this point   -encouraged use of lidocaine patches 4. Anxiety: discontinue valium due to sedation 5. Neuropsych: This patient is capable of making decisions on her own behalf. 6. Skin/Wound Care: Routine skin care checks 7. Fluids/Electrolytes/Nutrition: routine Is and Os and follow-up chemistries             --avoid dairy due to lactose intolerance 8: Pelvic fracture s/p percutaneous fixation             --WBAT on left lower extremity             --  TDWB on right lower extremity             --continue Vit D supplement weekly             --discussed that follow-up Xrs are stable 9: ABLA/small RP hematoma: stable; monitor H and H  -5/29 hgb 9.6 10: Urinary retention: Foley removed 5/19; d/c flomax Resolved, weaning medications 11: Hyperlipidemia: continue Lipitor 12: Hypokalemia: K+ 40 mEq BID; follow-up BMP 13: Hypotension w/ history of HTN --continue home Toprol XL 25 mg q HS             --d/c HCTZ              --continue losartan 100 mg daily             -increase magnesium gluconate to 500mg  HS  -decreased valium to HS  -5/29 still soft---reduce losartan to 50mg  14: Lactose intolerant: avoid dairy; Lactaid tabs TID 15. Vitamin D deficiency: Level is 15.32: continue ergocalciferol 50,000U once per week for 7 weeks 16. Fatigue: IM B12 ordered. Idecrease Modafinil to 100mg  in case impacting sleep at night.  17. Obesity: BMI 31.77: provided list of foods to help with weight loss.  18. UTI: >100,000 Klebsiella, start Bactrim, sensitive to Bactrim, discussed with patient.  19. Confusion: wean urecholine to 5mg  daily since voiding better  5/27- confusion much  better 20. Constipation?  5/27- KUB shows no moderate/large stool burden- will wait on adding meds.   5/29 only a smear on 5/27   -try sorbitol today   -dulcolax suppository   -add senna-s at bedtime 21. Insomnia: discontinue modafinil 22. Muscle soreness: Bengay ordered.         LOS: 14 days A FACE TO FACE EVALUATION WAS PERFORMED  Nathin Saran P Giannie Soliday 06/30/2021, 11:01 AM

## 2021-06-30 NOTE — Progress Notes (Signed)
Inpatient Rehabilitation Discharge Medication Review by a Pharmacist  A complete drug regimen review was completed for this patient to identify any potential clinically significant medication issues.  High Risk Drug Classes Is patient taking? Indication by Medication  Antipsychotic No   Anticoagulant Yes Apixaban for VTE ppx, stop date in place  Antibiotic No   Opioid Yes Oxycodone prn pain  Antiplatelet No   Hypoglycemics/insulin No   Vasoactive Medication Yes Losartan, metoprolol for BP  Chemotherapy No   Other Yes Gabapentin for pain  Tamsulosin for urinary retention  Atorvastatin for HLD     Type of Medication Issue Identified Description of Issue Recommendation(s)  Drug Interaction(s) (clinically significant)     Duplicate Therapy     Allergy     No Medication Administration End Date     Incorrect Dose     Additional Drug Therapy Needed     Significant med changes from prior encounter (inform family/care partners about these prior to discharge).    Other       Clinically significant medication issues were identified that warrant physician communication and completion of prescribed/recommended actions by midnight of the next day:  No   Pharmacist comments: None  Time spent performing this drug regimen review (minutes):  20 minutes   Elwin Sleight 06/30/2021 9:42 AM

## 2021-06-30 NOTE — Progress Notes (Signed)
Patient ID: Zoe Arnold, female   DOB: 1945-10-05, 76 y.o.   MRN: [id number]  This SW covering for assigned SW, Lavera Guise.   SW received updates from PT that pt does not feel they are able to take her home.   *After family edu, pt husband decided to take home. Pt not appropriate for extension.  Attending to follow-up with her husband to answer his questions.   Cecile Sheerer, MSW, LCSWA Office: [phone] Cell: [phone] Fax: [phone]

## 2021-06-30 NOTE — Progress Notes (Signed)
Occupational Therapy Session Note  Patient Details  Name: Zoe Arnold MRN: [id number] Date of Birth: Apr 10, 1945  Today's Date: 06/30/2021 OT Individual Time: 3235-5732 & 2025-4270 OT Individual Time Calculation (min): 70 min & 38 min OT missed time: 22 min Missed time reason: refusal without medical reason, fatigue   Short Term Goals: Week 2:  OT Short Term Goal 1 (Week 2): STG = LTG 2/2 ELOS  Skilled Therapeutic Interventions/Progress Updates:  Session 1 Skilled OT intervention completed with focus on d/c planning, pain management education, functional transfers, self-care. Pt received supine in bed, no initial c/o pain however 7/10 pain in R hip with movement, pre-medicated, with education provided about repositioning in bed with rest breaks provided for pain reduction. Pt required an unreasonable amount of time to transition during bed mobility, during all transfers and ADL tasks, however once given time, pt did participate. Completed bed mobility with use of bed features for [facility] elevated with supervision. Pt expecting to scoot to w/c, however with max encouragement completed sit > stand using RW with CGA and CGA stand pivot to w/c. Completed donning of fresh shirt and kimono with set up A, then doffing of pants at seated level using lateral leans (pt preference) with supervision, mod A LB donning for over RLE and over hips at the stance level. Pt very resistive to education about how to maximize independence and make the tasks more efficient, therefore more assist needed vs pt's potential. Seated at sink, pt completed grooming tasks with set up A, with cues needed for pt to realize the sink was about to overflow. Pt with questions about heating pad at home for pain management with suggestions provided about where to purchase, with handout provided for heating pad option online, as well as handout for comfort pillow that can be used for positioning in bed (pregnancy pillow). Pt was left seated  in w/c, with belt alarm on, and all needs in reach at end of session.  Session 2 Skilled OT intervention completed with focus on family education with husband and son present. Pt received seated in w/c, c/o fatigue, however no pain. Therapist educated on pt's current assist level, recommendations for toileting including using BSC at bedside, sponge bathing vs shower 2/2 poor endurance and lack of participation with task in OT sessions, as well as BSC assembly and safety tips.   Encouraged pt to demonstrate toilet transfer with family practicing hands on however pt refused stating "I'm too tired, we'll figure it out at home." Therapist tried to reinforce purpose of family education session with it being imperative that family practicing assisting pt to prepare them for their new care giving roles at home. Pt refused again with pt's husband stating "we don't have to if we don't want to." Pt requesting to return to bed, with therapist having pt's husband and son assist pt with sit > stand at min A level, stand pivot using RW at min A to EOB, then max A bed mobility. Therapist provided education on proper body mechanics and positioning, and how to maximize pt safety and participation. Educated on bed features to use with hospital bed at home, to prevent injury and make tasks easier for both parties. Pt and family denied having further questions, and were not very receptive to any further education, therefore pt missed 22 mins of OT intervention 2/2 refusal to participate and fatigue. Pt was left upright in bed, with bed alarm on and all needs in reach at end of session.  Therapy Documentation Precautions:  Precautions Precautions: Fall Precaution Comments: Weightbearing restrictions Restrictions Weight Bearing Restrictions: Yes RLE Weight Bearing: Touchdown weight bearing LLE Weight Bearing: Weight bearing as tolerated    Therapy/Group: Individual Therapy  Jonmarc Bodkin E Yara Tomkinson 06/30/2021, 7:29 AM

## 2021-06-30 NOTE — Progress Notes (Signed)
Physical Therapy Session Note  Patient Details  Name: Zoe Arnold MRN: [id number] Date of Birth: November 08, 1945  Today's Date: 06/30/2021 PT Individual Time: 1031-1100 PT Individual Time Calculation (min): 29 min   Short Term Goals: Week 1:  PT Short Term Goal 1 (Week 1): pt will roll L/R with mod A of 1 PT Short Term Goal 1 - Progress (Week 1): Met PT Short Term Goal 2 (Week 1): pt will transfer sit<>stand with LRAD and max A of 1 PT Short Term Goal 2 - Progress (Week 1): Met PT Short Term Goal 3 (Week 1): pt will transfer bed<>chair with LRAD and max A of 1 PT Short Term Goal 3 - Progress (Week 1): Met Week 2:  PT Short Term Goal 1 (Week 2): STG =LTG due to LOS  Skilled Therapeutic Interventions/Progress Updates:  Patient seated upright in w/c on entrance to room. Patient alert and agreeable to PT session.   Patient with pain complaint in R>L buttock from being in upright seated position in w/c for 2mn since the end of her OT session. Crying from pain as she doesn't think that people believe her when she talks re: her pain from pelvic fracture. Also relates that she does not want to take pain medication. Educated pt that pain medication should only be used for when pt is experiencing pain. The opioids are so strong that these particular meds are only for now and not forever. These will help pt to feel less pain and participate more actively in therapy even after d/c.   Therapeutic Activity: Transfers: Patient performed sit<>stand and stand pivot transfers throughout session with CGA/ supervision. Requires extra time to complete. Provided verbal cues for technique. Pt does not like ROHO cushion in w/c and would like to sit on pillow in recliner. Stand pivot to recliner with CGA. Pt able to lift self with BUE and scoot back 2x in seat with supervision.   MMT completed while seated upright in w/c. See d/c paperwork for results.   Gait Training:  Patient ambulated 8 ft using RW with CGA.  Difficulty keeping RLE ahead of LLE. Provided vc/ tc for managing TDWB technique. New personal RW adjusted for pt's height.   Patient seated upright  in recliner at end of session with brakes locked, belt alarm set, and all needs within reach. PA present at end of session to complete medication discharge paperwork.    Therapy Documentation Precautions:  Precautions Precautions: Fall Precaution Comments: Weightbearing restrictions Restrictions Weight Bearing Restrictions: Yes RUE Weight Bearing: Weight bearing as tolerated LUE Weight Bearing: Weight bearing as tolerated RLE Weight Bearing: Touchdown weight bearing LLE Weight Bearing: Weight bearing as tolerated General:   Vital Signs:  Pain:  Pt with general pain at buttocks R>L with sitting for >349m just prior to session.   Therapy/Group: Individual Therapy  JuAlger SimonsT, DPT 06/30/2021, 12:30 PM

## 2021-06-30 NOTE — Progress Notes (Addendum)
Discharge medication prescriptions filled by TOC. Dr. Carlis Abbott has discontinued Flomax and I contacted TOC to not fill. Meds secured until discharge on 6/3.

## 2021-07-01 NOTE — Progress Notes (Addendum)
Patient states she can not go home that she does not have the proper equipment delivered at this time. Attempted to call Zoe Pacas LCSW, no answer left message. Spoke Zoe Nutting NP, states she will send Dr. Ranell Patrick to round with patient today.  Patient complained of back pain. PRN Oxy and robaxin given as ordered.   [place] at [phone]. Spoke to [place] who states he will contact the home office who will than call me back with delivery date and time.   1200 Spoke to [employer] who states patents son Zoe Arnold rescheduled delivery date until Tuesday.   1330 Patient son Zoe Arnold arrieved and angry states that he did not call and reschedule bed delivery. That [employer] attempted to deliver the bed and family was not notified of delivery date and time.  57 Dr. Adam Arnold notified of son being in patients room. Dr arrieved in the patient room and spoke to patient and son. Writer left the room a this time to allow Dr to [place] with family and deescalate son Zoe Arnold.  Dr. Adam Arnold ok'ed to D/C patient.  Patient family to assist.    1350 Writer took additional Therapist, sports ([place]) into room. Patient Lidoderm patches applied to lower back and buttock, Tylenol 650 mg given as ordered. Patient stated with additional nurse in room that she may not even need a hospital bed. Patient advised to call [employer] if she wanted to decline hospital bed.   Patient D/C with son Zoe Arnold at approximately  1530

## 2021-07-01 NOTE — Progress Notes (Signed)
PROGRESS NOTE   Subjective/Complaints: Hospital bed has not been delivered and patient is stressed by this, she is not sure whether she wants to go home or not. Husband not yet here.   ROS: Patient denies fever, rash, sore throat, blurred vision, dizziness, nausea, vomiting, diarrhea, cough, shortness of breath or chest pain,   headache, or mood change. +insomnia, +pelvic fracture pain- continues, +low back pain  Objective:   No results found. No results for input(s): WBC, HGB, HCT, PLT in the last 72 hours.  No results for input(s): NA, K, CL, CO2, GLUCOSE, BUN, CREATININE, CALCIUM in the last 72 hours.   Intake/Output Summary (Last 24 hours) at 07/01/2021 1148 Last data filed at 07/01/2021 0900 Gross per 24 hour  Intake 60 ml  Output --  Net 60 ml         Physical Exam: Vital Signs Blood pressure 110/68, pulse 85, temperature 98 F (36.7 C), temperature source Oral, resp. rate 18, height 5\' 2"  (1.575 m), weight 78.2 kg, SpO2 95 %.  Constitutional: No distress . Vital signs reviewed. BMI 31.53, fatigued HEENT: NCAT, EOMI, oral membranes moist Neck: supple Cardiovascular: RRR without murmur. No JVD    Respiratory/Chest: CTA Bilaterally without wheezes or rales. Normal effort    GI/Abdomen: BS +, non-tender, non-distended Ext: no clubbing, cyanosis, or edema Psych: pleasant but anxious. Lack of motivation  Musculoskeletal:        General: No swelling. RLE 3/5, LLE 4/5.  Has pain with movement of pelvis and  LE's R>L Sit to stand CG with RW Skin:    General: Skin is warm and dry. +MASD to sacrum. Incisions c/d/i Neurological:     General: No focal deficit present.     Mental Status: She is alert and oriented to person, place, and time.  Slow at taking medications in the morning Functional mobility: Ambulating CG RW    Assessment/Plan: 1. Functional deficits which require 3+ hours per day of interdisciplinary  therapy in a comprehensive inpatient rehab setting. Physiatrist is providing close team supervision and 24 hour management of active medical problems listed below. Physiatrist and rehab team continue to assess barriers to discharge/monitor patient progress toward functional and medical goals  Care Tool:  Bathing  Bathing activity did not occur: Safety/medical concerns Body parts bathed by patient: Right arm, Left arm, Chest, Abdomen, Right upper leg, Left upper leg, Face   Body parts bathed by helper: Right lower leg, Left lower leg, Front perineal area, Buttocks     Bathing assist Assist Level: Moderate Assistance - Patient 50 - 74%     Upper Body Dressing/Undressing Upper body dressing Upper body dressing/undressing activity did not occur (including orthotics): Safety/medical concerns What is the patient wearing?: Pull over shirt    Upper body assist Assist Level: Set up assist    Lower Body Dressing/Undressing Lower body dressing      What is the patient wearing?: Pants     Lower body assist Assist for lower body dressing: Moderate Assistance - Patient 50 - 74%     Toileting Toileting Toileting Activity did not occur (Clothing management and hygiene only): N/A (no void or bm)  Toileting assist Assist  for toileting: Moderate Assistance - Patient 50 - 74%     Transfers Chair/bed transfer  Transfers assist  Chair/bed transfer activity did not occur: Safety/medical concerns (pain, weakness/deconditioning, lethargic)  Chair/bed transfer assist level: Minimal Assistance - Patient > 75%     Locomotion Ambulation   Ambulation assist   Ambulation activity did not occur: Safety/medical concerns (pain, weakness/deconditioning, lethargic)  Assist level: Minimal Assistance - Patient > 75% Assistive device: Walker-rolling Max distance: 5 ft   Walk 10 feet activity   Assist  Walk 10 feet activity did not occur: Safety/medical concerns (pain, fatigue, poor  motivation, poor adherance to RLE TDWB precautions)        Walk 50 feet activity   Assist Walk 50 feet with 2 turns activity did not occur: Safety/medical concerns (pain, fatigue, poor motivation, poor adherance to RLE TDWB precautions)         Walk 150 feet activity   Assist Walk 150 feet activity did not occur: Safety/medical concerns (pain, fatigue, poor motivation, poor adherance to RLE TDWB precautions)         Walk 10 feet on uneven surface  activity   Assist Walk 10 feet on uneven surfaces activity did not occur: Refused         Wheelchair     Assist Is the patient using a wheelchair?: Yes Type of Wheelchair: Manual Wheelchair activity did not occur: Safety/medical concerns (pain, weakness/deconditioning, lethargic)  Wheelchair assist level: Supervision/Verbal cueing Max wheelchair distance: >15450ft    Wheelchair 50 feet with 2 turns activity    Assist    Wheelchair 50 feet with 2 turns activity did not occur: Safety/medical concerns (pain, weakness/deconditioning, lethargic)   Assist Level: Supervision/Verbal cueing   Wheelchair 150 feet activity     Assist  Wheelchair 150 feet activity did not occur: Safety/medical concerns (pain, weakness/deconditioning, lethargic)   Assist Level: Supervision/Verbal cueing   Blood pressure 110/68, pulse 85, temperature 98 F (36.7 C), temperature source Oral, resp. rate 18, height 5\' 2"  (1.575 m), weight 78.2 kg, SpO2 95 %.   Medical Problem List and Plan: 1. Functional deficits secondary to pelvic fracture             -patient may shower             -ELOS/Goals: 20-22 days S/MinA              -Therapy has been complicated by pain/fatigue, but this is improving.   -d/c home today 2.  Impaired mobility and ADLs: Conitnue apixaban 2.5 mg for 30 days (stop 6/17). Hospital bed ordered but not yet delivered, to be delivered today.              -antiplatelet therapy: ? restart home aspirin 81 mg 3.  Pain from pelvic fracture: Currently receiving scheduled Tylenol and Valium BID. Using Ultram 100 mg approximately once a day and oxycodone 5 mg infrequently (q 4 hours prn). D/c Dilaudid. Robaxin started yesterday. Add vitamin C 1,000mg  daily. Provided list of foods to help with pain. Add kpad. Continue Gabapentin 100mg  HS. Provided with pain relief journal. Discussed increasing Gabapentin dose HS and she will discuss with her husband  5/28- Kpad not available- will increase Oxy to 5-10 mg q4 hours prn for now. So won't use IV Dilaudid as much.   5/29 using kpad this morning. She feels that she has "enough" medications, and i'm frankly hesitant to increase anything else at this point   -encouraged use of lidocaine patches 4. Anxiety:  discontinue valium due to sedation 5. Neuropsych: This patient is capable of making decisions on her own behalf. 6. Skin/Wound Care: Routine skin care checks 7. Fluids/Electrolytes/Nutrition: routine Is and Os and follow-up chemistries             --avoid dairy due to lactose intolerance 8: Pelvic fracture s/p percutaneous fixation             --WBAT on left lower extremity             --TDWB on right lower extremity             --continue Vit D supplement weekly             --discussed that follow-up Xrs are stable 9: ABLA/small RP hematoma: stable; monitor H and H  -5/29 hgb 9.6 10: Urinary retention: Foley removed 5/19; d/c flomax Resolved, weaning medications 11: Hyperlipidemia: continue Lipitor 12: Hypokalemia: K+ 40 mEq BID; follow-up BMP 13: Hypotension w/ history of HTN --continue home Toprol XL 25 mg q HS             --d/c HCTZ              --continue losartan 100 mg daily             -increase magnesium gluconate to 500mg  HS  -decreased valium to HS  -5/29 still soft---reduce losartan to 50mg  14: Lactose intolerant: avoid dairy; Lactaid tabs TID 15. Vitamin D deficiency: Level is 15.32: continue ergocalciferol 50,000U once per week for 7 weeks 16.  Fatigue: IM B12 ordered. Idecrease Modafinil to 100mg  in case impacting sleep at night.  17. Obesity: BMI 31.77: provided list of foods to help with weight loss.  18. UTI: >100,000 Klebsiella, start Bactrim, sensitive to Bactrim, discussed with patient.  19. Confusion: wean urecholine to 5mg  daily since voiding better  5/27- confusion much better 20. Constipation?  5/27- KUB shows no moderate/large stool burden- will wait on adding meds.   5/29 only a smear on 5/27   -try sorbitol today   -dulcolax suppository   -add senna-s at bedtime 21. Insomnia: discontinue modafinil 22. Muscle soreness: Bengay ordered.    >30 minutes spent in discharge of patient including review of medications and follow-up appointments, physical examination, and in answering all patient's questions       LOS: 15 days A FACE TO FACE EVALUATION WAS PERFORMED  6/27 P Taren Toops 07/01/2021, 11:48 AM

## 2021-07-01 NOTE — Progress Notes (Signed)
Spoke to the husband  Zoe Arnold, about call from [facility] stating that the bed delivery was rescheduled from Friday to next Tuesday by the son, Zoe Arnold. He stated that [employer] did not have their contact information & came out to the house while no one was there. When they found out & called, [facility] told them that they did not have another delivery available until Tuesday.

## 2021-07-07 ENCOUNTER — Telehealth: Payer: Self-pay

## 2021-07-07 NOTE — Telephone Encounter (Signed)
Patient husband called stating that the hospital bed that [facility] sent rail don't go and it cranks. He is requesting a fully electric bed. I sent an order to [facility] for the electric bed.

## 2021-08-11 ENCOUNTER — Telehealth: Payer: Self-pay

## 2021-08-11 NOTE — Telephone Encounter (Signed)
Zoe Arnold called and stated she is going to hold PT for a week until the patient's orthopedic appointment. She will resume PT on 08/22/21

## 2021-08-22 ENCOUNTER — Encounter
Payer: Medicare Other | Attending: Physical Medicine and Rehabilitation | Admitting: Physical Medicine and Rehabilitation

## 2021-09-01 ENCOUNTER — Telehealth: Payer: Self-pay | Admitting: *Deleted

## 2021-09-01 NOTE — Telephone Encounter (Signed)
Vernona Rieger request PT POC 1wk6, qowk2. Approval given.

## 2021-09-13 ENCOUNTER — Telehealth: Payer: Self-pay

## 2021-09-13 NOTE — Telephone Encounter (Signed)
Message on voicemail  at 9:51 am today:   Zoe Arnold had a loss of consciousness yesterday. Joseph Art the physical therapist was in the home. Patient refused to go to the hospital by EMS. A 45 minute stereum rub was done to awaken the patient. Before the episode she reported not eating, cold symptoms and low blood pressure.   Call back phone number Tammy [phone]. Sayuri Rhames [phone]  Today I was not able to reach the Physical Therapist (voice mail full). However Sonna Lipsky stated, in her professional opinion she was was doing fine today. Patient has been advised to call her Pcp for follow up and advice.

## 2021-11-07 ENCOUNTER — Encounter: Payer: Medicare Other | Admitting: Physical Medicine and Rehabilitation
